# Patient Record
Sex: Female | Born: 1965 | Race: White | Hispanic: No | Smoking: Current every day smoker
Health system: Southern US, Community
[De-identification: ages and names within clinical notes are randomized; demographics above are authoritative.]

## PROBLEM LIST (undated history)

## (undated) DIAGNOSIS — F419 Anxiety disorder, unspecified: Secondary | ICD-10-CM

## (undated) DIAGNOSIS — F32A Depression, unspecified: Secondary | ICD-10-CM

## (undated) DIAGNOSIS — E039 Hypothyroidism, unspecified: Secondary | ICD-10-CM

## (undated) DIAGNOSIS — E119 Type 2 diabetes mellitus without complications: Secondary | ICD-10-CM

## (undated) DIAGNOSIS — I219 Acute myocardial infarction, unspecified: Secondary | ICD-10-CM

## (undated) DIAGNOSIS — M1990 Unspecified osteoarthritis, unspecified site: Secondary | ICD-10-CM

## (undated) DIAGNOSIS — G4730 Sleep apnea, unspecified: Secondary | ICD-10-CM

## (undated) DIAGNOSIS — J40 Bronchitis, not specified as acute or chronic: Secondary | ICD-10-CM

## (undated) DIAGNOSIS — C801 Malignant (primary) neoplasm, unspecified: Secondary | ICD-10-CM

## (undated) DIAGNOSIS — K469 Unspecified abdominal hernia without obstruction or gangrene: Secondary | ICD-10-CM

## (undated) DIAGNOSIS — J449 Chronic obstructive pulmonary disease, unspecified: Secondary | ICD-10-CM

## (undated) DIAGNOSIS — F329 Major depressive disorder, single episode, unspecified: Secondary | ICD-10-CM

## (undated) HISTORY — PX: CHOLECYSTECTOMY: SHX55

## (undated) HISTORY — PX: COLONOSCOPY: SHX174

## (undated) HISTORY — PX: ABDOMINAL HYSTERECTOMY: SHX81

## (undated) HISTORY — PX: UPPER GI ENDOSCOPY: SHX6162

---

## 1998-03-09 HISTORY — PX: BACK SURGERY: SHX140

## 2001-03-08 ENCOUNTER — Emergency Department (HOSPITAL_COMMUNITY): Admission: EM | Admit: 2001-03-08 | Discharge: 2001-03-08 | Payer: Self-pay | Admitting: Emergency Medicine

## 2001-03-10 ENCOUNTER — Emergency Department (HOSPITAL_COMMUNITY): Admission: EM | Admit: 2001-03-10 | Discharge: 2001-03-10 | Payer: Self-pay | Admitting: Emergency Medicine

## 2001-03-18 ENCOUNTER — Emergency Department (HOSPITAL_COMMUNITY): Admission: EM | Admit: 2001-03-18 | Discharge: 2001-03-18 | Payer: Self-pay | Admitting: Emergency Medicine

## 2001-04-30 ENCOUNTER — Emergency Department (HOSPITAL_COMMUNITY): Admission: EM | Admit: 2001-04-30 | Discharge: 2001-04-30 | Payer: Self-pay | Admitting: Emergency Medicine

## 2001-04-30 ENCOUNTER — Encounter: Payer: Self-pay | Admitting: Emergency Medicine

## 2003-04-11 ENCOUNTER — Emergency Department (HOSPITAL_COMMUNITY): Admission: EM | Admit: 2003-04-11 | Discharge: 2003-04-11 | Payer: Self-pay

## 2005-03-09 HISTORY — PX: MASTECTOMY: SHX3

## 2009-03-09 HISTORY — PX: BREAST SURGERY: SHX581

## 2009-04-01 ENCOUNTER — Encounter: Admission: RE | Admit: 2009-04-01 | Discharge: 2009-04-01 | Payer: Self-pay | Admitting: Infectious Diseases

## 2009-05-02 ENCOUNTER — Ambulatory Visit (HOSPITAL_COMMUNITY): Admission: RE | Admit: 2009-05-02 | Discharge: 2009-05-02 | Payer: Self-pay | Admitting: Surgery

## 2009-07-31 ENCOUNTER — Emergency Department (HOSPITAL_COMMUNITY): Admission: EM | Admit: 2009-07-31 | Discharge: 2009-07-31 | Payer: Self-pay | Admitting: Emergency Medicine

## 2009-08-20 ENCOUNTER — Ambulatory Visit (HOSPITAL_COMMUNITY): Admission: RE | Admit: 2009-08-20 | Discharge: 2009-08-20 | Payer: Self-pay | Admitting: Orthopedic Surgery

## 2009-08-22 ENCOUNTER — Ambulatory Visit (HOSPITAL_COMMUNITY): Admission: RE | Admit: 2009-08-22 | Discharge: 2009-08-22 | Payer: Self-pay | Admitting: Gastroenterology

## 2009-09-12 ENCOUNTER — Ambulatory Visit (HOSPITAL_COMMUNITY): Admission: RE | Admit: 2009-09-12 | Discharge: 2009-09-12 | Payer: Self-pay | Admitting: Gastroenterology

## 2009-09-19 ENCOUNTER — Ambulatory Visit (HOSPITAL_COMMUNITY): Admission: RE | Admit: 2009-09-19 | Discharge: 2009-09-19 | Payer: Self-pay | Admitting: Gastroenterology

## 2009-09-26 ENCOUNTER — Ambulatory Visit (HOSPITAL_COMMUNITY): Admission: RE | Admit: 2009-09-26 | Discharge: 2009-09-26 | Payer: Self-pay | Admitting: Gastroenterology

## 2010-04-09 ENCOUNTER — Encounter: Payer: Self-pay | Admitting: Oncology

## 2010-04-22 ENCOUNTER — Emergency Department (HOSPITAL_COMMUNITY): Payer: Medicaid Other

## 2010-04-22 ENCOUNTER — Emergency Department (HOSPITAL_COMMUNITY)
Admission: EM | Admit: 2010-04-22 | Discharge: 2010-04-22 | Disposition: A | Discharge status: Home / Self Care | Payer: Medicaid Other | Attending: Emergency Medicine | Admitting: Emergency Medicine

## 2010-04-22 DIAGNOSIS — Z853 Personal history of malignant neoplasm of breast: Secondary | ICD-10-CM | POA: Insufficient documentation

## 2010-04-22 DIAGNOSIS — W208XXA Other cause of strike by thrown, projected or falling object, initial encounter: Secondary | ICD-10-CM | POA: Insufficient documentation

## 2010-04-22 DIAGNOSIS — IMO0002 Reserved for concepts with insufficient information to code with codable children: Secondary | ICD-10-CM | POA: Insufficient documentation

## 2010-04-22 DIAGNOSIS — E039 Hypothyroidism, unspecified: Secondary | ICD-10-CM | POA: Insufficient documentation

## 2010-04-22 DIAGNOSIS — Y92009 Unspecified place in unspecified non-institutional (private) residence as the place of occurrence of the external cause: Secondary | ICD-10-CM | POA: Insufficient documentation

## 2010-04-22 DIAGNOSIS — S51809A Unspecified open wound of unspecified forearm, initial encounter: Secondary | ICD-10-CM | POA: Insufficient documentation

## 2010-04-22 DIAGNOSIS — S6990XA Unspecified injury of unspecified wrist, hand and finger(s), initial encounter: Secondary | ICD-10-CM | POA: Insufficient documentation

## 2010-04-22 DIAGNOSIS — M7989 Other specified soft tissue disorders: Secondary | ICD-10-CM | POA: Insufficient documentation

## 2010-04-22 DIAGNOSIS — E119 Type 2 diabetes mellitus without complications: Secondary | ICD-10-CM | POA: Insufficient documentation

## 2010-04-22 DIAGNOSIS — S59909A Unspecified injury of unspecified elbow, initial encounter: Secondary | ICD-10-CM | POA: Insufficient documentation

## 2010-04-22 DIAGNOSIS — Z79899 Other long term (current) drug therapy: Secondary | ICD-10-CM | POA: Insufficient documentation

## 2010-04-22 DIAGNOSIS — S5010XA Contusion of unspecified forearm, initial encounter: Secondary | ICD-10-CM | POA: Insufficient documentation

## 2010-04-22 DIAGNOSIS — M79609 Pain in unspecified limb: Secondary | ICD-10-CM | POA: Insufficient documentation

## 2010-04-22 DIAGNOSIS — E669 Obesity, unspecified: Secondary | ICD-10-CM | POA: Insufficient documentation

## 2010-05-25 ENCOUNTER — Emergency Department (HOSPITAL_COMMUNITY): Payer: Medicaid Other

## 2010-05-25 ENCOUNTER — Emergency Department (HOSPITAL_COMMUNITY)
Admission: EM | Admit: 2010-05-25 | Discharge: 2010-05-25 | Disposition: A | Discharge status: Home / Self Care | Payer: Medicaid Other | Attending: Emergency Medicine | Admitting: Emergency Medicine

## 2010-05-25 DIAGNOSIS — IMO0001 Reserved for inherently not codable concepts without codable children: Secondary | ICD-10-CM | POA: Insufficient documentation

## 2010-05-25 DIAGNOSIS — M25569 Pain in unspecified knee: Secondary | ICD-10-CM | POA: Insufficient documentation

## 2010-05-25 DIAGNOSIS — E039 Hypothyroidism, unspecified: Secondary | ICD-10-CM | POA: Insufficient documentation

## 2010-05-25 DIAGNOSIS — Y92009 Unspecified place in unspecified non-institutional (private) residence as the place of occurrence of the external cause: Secondary | ICD-10-CM | POA: Insufficient documentation

## 2010-05-25 DIAGNOSIS — E119 Type 2 diabetes mellitus without complications: Secondary | ICD-10-CM | POA: Insufficient documentation

## 2010-05-25 DIAGNOSIS — IMO0002 Reserved for concepts with insufficient information to code with codable children: Secondary | ICD-10-CM | POA: Insufficient documentation

## 2010-05-25 DIAGNOSIS — R0789 Other chest pain: Secondary | ICD-10-CM | POA: Insufficient documentation

## 2010-05-25 DIAGNOSIS — R040 Epistaxis: Secondary | ICD-10-CM | POA: Insufficient documentation

## 2010-05-25 DIAGNOSIS — S8000XA Contusion of unspecified knee, initial encounter: Secondary | ICD-10-CM | POA: Insufficient documentation

## 2010-05-29 LAB — BASIC METABOLIC PANEL
CO2: 24 mEq/L (ref 19–32)
Calcium: 9.7 mg/dL (ref 8.4–10.5)
Glucose, Bld: 405 mg/dL — ABNORMAL HIGH (ref 70–99)
Sodium: 133 mEq/L — ABNORMAL LOW (ref 135–145)

## 2010-05-29 LAB — GLUCOSE, CAPILLARY: Glucose-Capillary: 227 mg/dL — ABNORMAL HIGH (ref 70–99)

## 2010-05-29 LAB — CBC
HCT: 43.9 % (ref 36.0–46.0)
Hemoglobin: 15.2 g/dL — ABNORMAL HIGH (ref 12.0–15.0)
MCHC: 34.6 g/dL (ref 30.0–36.0)
MCV: 93.8 fL (ref 78.0–100.0)
RDW: 14.1 % (ref 11.5–15.5)

## 2010-06-21 ENCOUNTER — Emergency Department (HOSPITAL_COMMUNITY)
Admission: EM | Admit: 2010-06-21 | Discharge: 2010-06-21 | Disposition: A | Discharge status: Home / Self Care | Payer: Medicaid Other | Attending: Emergency Medicine | Admitting: Emergency Medicine

## 2010-06-21 ENCOUNTER — Emergency Department (HOSPITAL_COMMUNITY): Payer: Medicaid Other

## 2010-06-21 DIAGNOSIS — M51379 Other intervertebral disc degeneration, lumbosacral region without mention of lumbar back pain or lower extremity pain: Secondary | ICD-10-CM | POA: Insufficient documentation

## 2010-06-21 DIAGNOSIS — M5137 Other intervertebral disc degeneration, lumbosacral region: Secondary | ICD-10-CM | POA: Insufficient documentation

## 2010-06-21 DIAGNOSIS — Z853 Personal history of malignant neoplasm of breast: Secondary | ICD-10-CM | POA: Insufficient documentation

## 2010-06-21 DIAGNOSIS — M549 Dorsalgia, unspecified: Secondary | ICD-10-CM | POA: Insufficient documentation

## 2010-06-21 DIAGNOSIS — W010XXA Fall on same level from slipping, tripping and stumbling without subsequent striking against object, initial encounter: Secondary | ICD-10-CM | POA: Insufficient documentation

## 2010-06-21 DIAGNOSIS — T148XXA Other injury of unspecified body region, initial encounter: Secondary | ICD-10-CM | POA: Insufficient documentation

## 2010-09-09 ENCOUNTER — Emergency Department (HOSPITAL_COMMUNITY): Payer: Medicaid Other

## 2010-09-09 ENCOUNTER — Emergency Department (HOSPITAL_COMMUNITY)
Admission: EM | Admit: 2010-09-09 | Discharge: 2010-09-09 | Disposition: A | Discharge status: Home / Self Care | Payer: Medicaid Other | Attending: Emergency Medicine | Admitting: Emergency Medicine

## 2010-09-09 DIAGNOSIS — E119 Type 2 diabetes mellitus without complications: Secondary | ICD-10-CM | POA: Insufficient documentation

## 2010-09-09 DIAGNOSIS — X500XXA Overexertion from strenuous movement or load, initial encounter: Secondary | ICD-10-CM | POA: Insufficient documentation

## 2010-09-09 DIAGNOSIS — E039 Hypothyroidism, unspecified: Secondary | ICD-10-CM | POA: Insufficient documentation

## 2010-09-09 DIAGNOSIS — Z853 Personal history of malignant neoplasm of breast: Secondary | ICD-10-CM | POA: Insufficient documentation

## 2010-09-09 DIAGNOSIS — G589 Mononeuropathy, unspecified: Secondary | ICD-10-CM | POA: Insufficient documentation

## 2010-09-09 DIAGNOSIS — M545 Low back pain, unspecified: Secondary | ICD-10-CM | POA: Insufficient documentation

## 2011-03-27 ENCOUNTER — Encounter (INDEPENDENT_AMBULATORY_CARE_PROVIDER_SITE_OTHER): Payer: Self-pay | Admitting: Surgery

## 2011-08-12 IMAGING — CR DG LUMBAR SPINE COMPLETE 4+V
5 series · 5 of 5 positions shown · non-contrast
Comparison: MRI 08/20/2009.  Radiography 07/31/2009

CLINICAL DATA: Fell.  Pain.

LUMBAR SPINE - COMPLETE 4+ VIEW

[t l-spine a.p.]
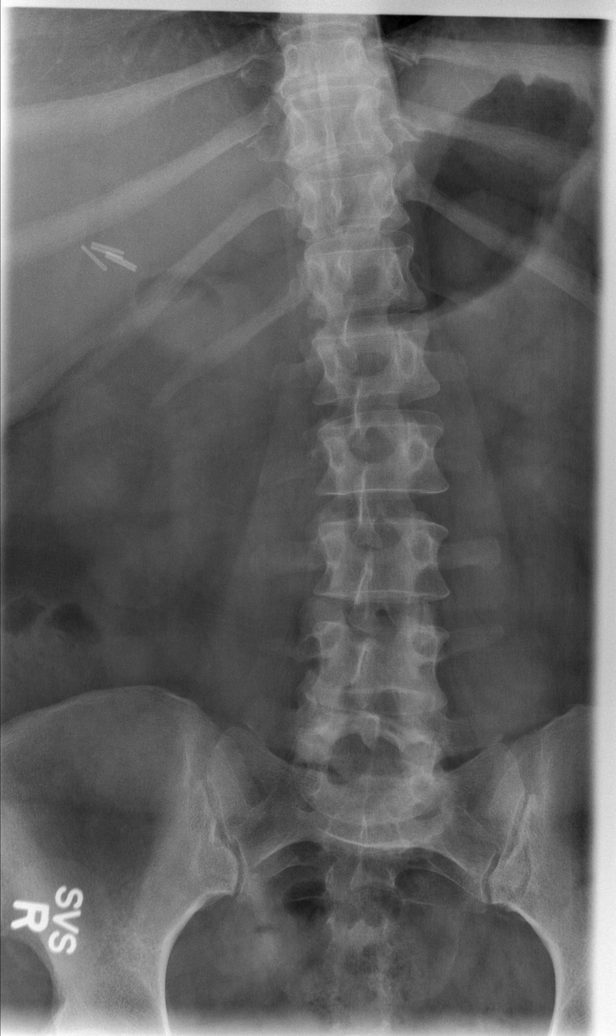

[t l-spine oblique exposure (1 of 2)]
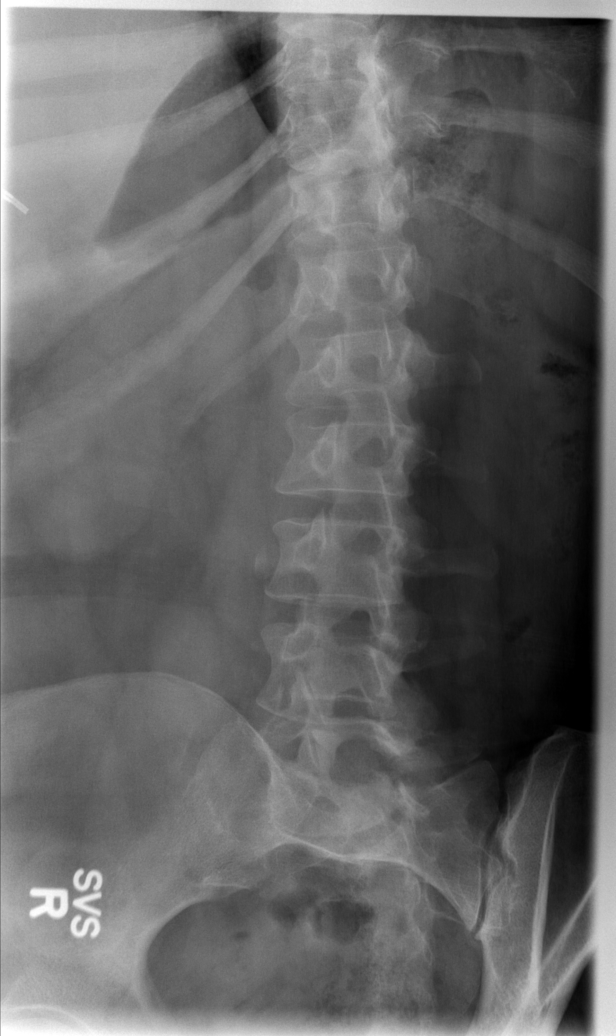

[t l-spine oblique exposure (2 of 2)]
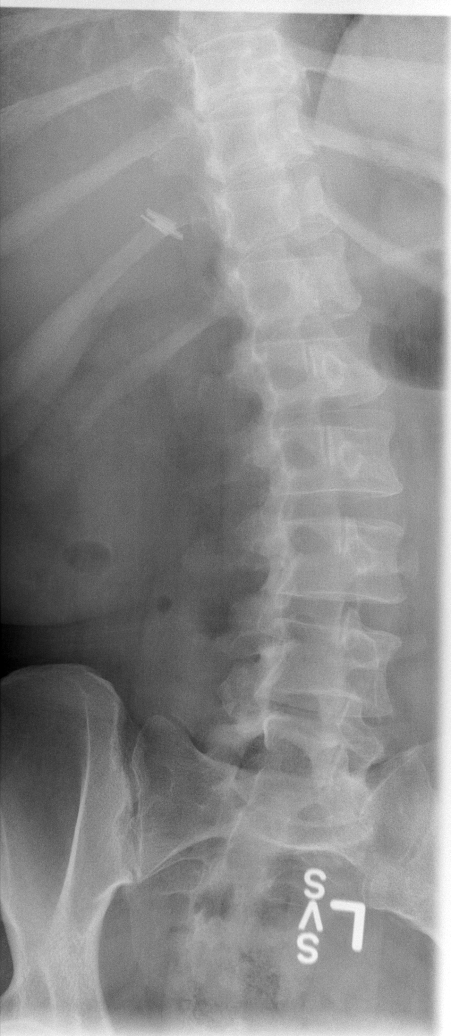

[t l-spine lat]
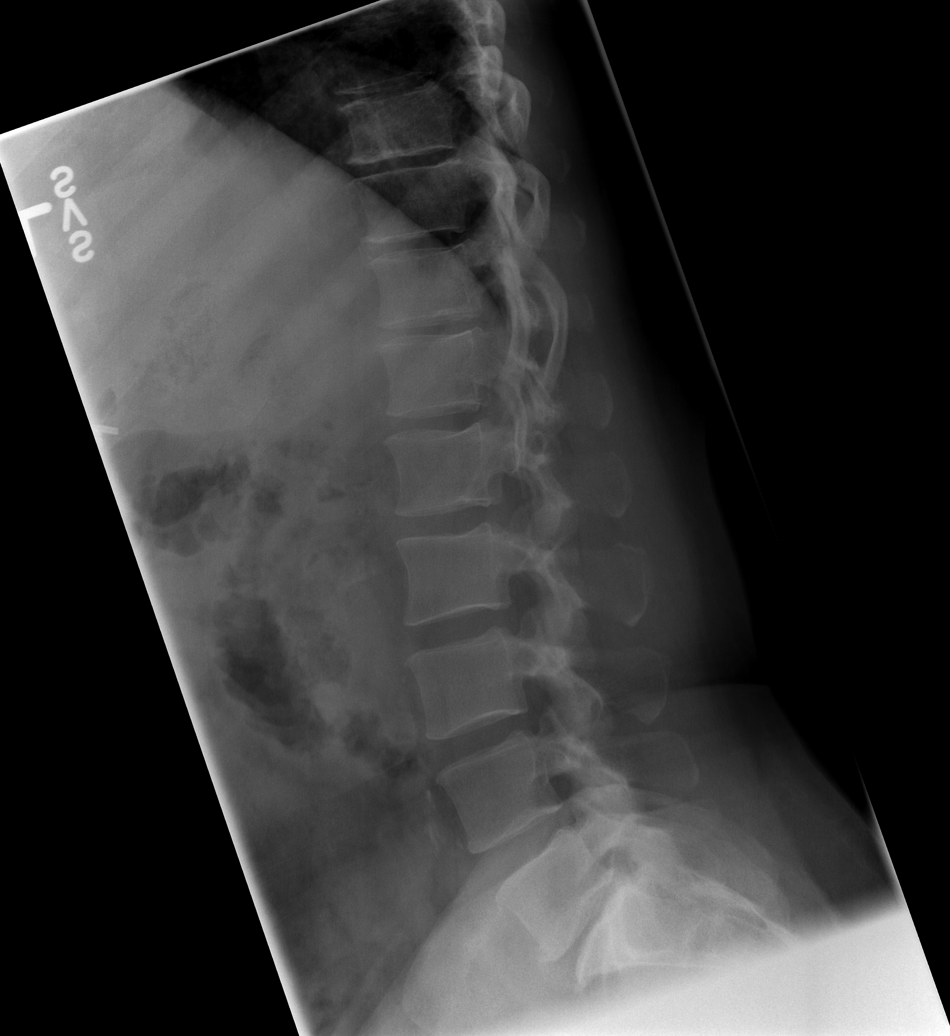

[t l-spine l5-s1 spot]
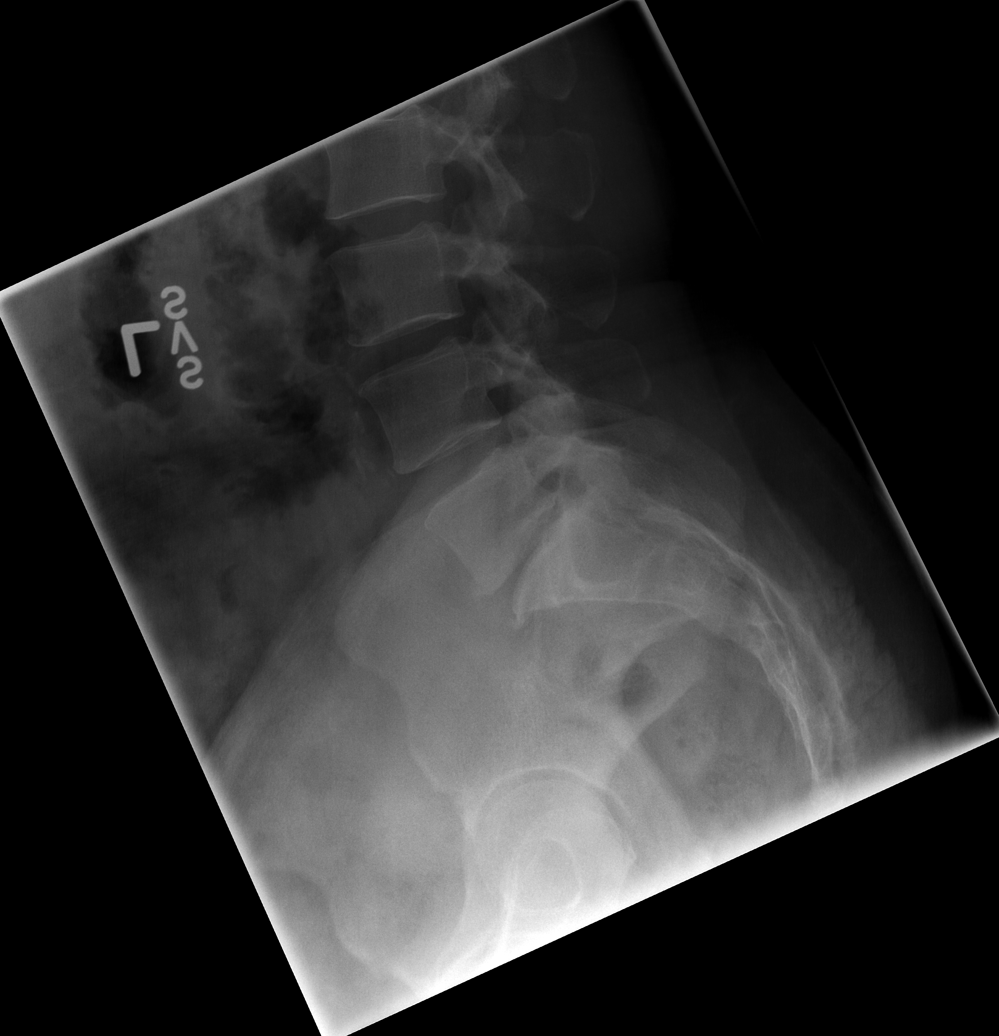

[5 of 5 positions shown; findings below may reference images not displayed]

FINDINGS: No abnormality is seen at L4-5 or above.  At L5-S1, there
is chronic degenerative change with loss of disc height and
marginal osteophytes.  No evidence of slippage.  Sacroiliac joints
appear unremarkable.
IMPRESSION: Chronic degenerative disc disease L5-S1.  No gross change when
compared to previous exams.

## 2012-11-23 ENCOUNTER — Encounter (HOSPITAL_BASED_OUTPATIENT_CLINIC_OR_DEPARTMENT_OTHER): Payer: Self-pay | Admitting: *Deleted

## 2012-11-23 NOTE — Progress Notes (Signed)
Pt had mast 07 with chemo-rad-diabetic-copd-trying to quit smoking-anxiety-had pain from a hernia dr Aurelio Jew is aware Told to bring all meds and inhalers-overnight bag and cpap-may need to stay due to sleep apnea

## 2012-11-25 ENCOUNTER — Encounter (HOSPITAL_BASED_OUTPATIENT_CLINIC_OR_DEPARTMENT_OTHER)
Admission: RE | Admit: 2012-11-25 | Discharge: 2012-11-25 | Disposition: A | Discharge status: Home / Self Care | Payer: Medicaid Other | Source: Ambulatory Visit | Attending: Specialist | Admitting: Specialist

## 2012-11-25 LAB — BASIC METABOLIC PANEL
BUN: 16 mg/dL (ref 6–23)
Chloride: 97 mEq/L (ref 96–112)
Glucose, Bld: 118 mg/dL — ABNORMAL HIGH (ref 70–99)
Potassium: 4.4 mEq/L (ref 3.5–5.1)

## 2012-11-28 ENCOUNTER — Encounter (HOSPITAL_BASED_OUTPATIENT_CLINIC_OR_DEPARTMENT_OTHER): Payer: Self-pay | Admitting: Anesthesiology

## 2012-11-28 ENCOUNTER — Ambulatory Visit (HOSPITAL_BASED_OUTPATIENT_CLINIC_OR_DEPARTMENT_OTHER)
Admission: RE | Admit: 2012-11-28 | Discharge: 2012-11-28 | Disposition: A | Discharge status: Home / Self Care | Payer: Medicaid Other | Source: Ambulatory Visit | Attending: Specialist | Admitting: Specialist

## 2012-11-28 ENCOUNTER — Encounter (HOSPITAL_BASED_OUTPATIENT_CLINIC_OR_DEPARTMENT_OTHER)
Admission: RE | Disposition: A | Discharge status: Home / Self Care | Payer: Self-pay | Source: Ambulatory Visit | Attending: Specialist

## 2012-11-28 ENCOUNTER — Ambulatory Visit (HOSPITAL_BASED_OUTPATIENT_CLINIC_OR_DEPARTMENT_OTHER): Payer: Medicaid Other | Admitting: Anesthesiology

## 2012-11-28 DIAGNOSIS — Z9010 Acquired absence of unspecified breast and nipple: Secondary | ICD-10-CM | POA: Insufficient documentation

## 2012-11-28 DIAGNOSIS — Z853 Personal history of malignant neoplasm of breast: Secondary | ICD-10-CM | POA: Insufficient documentation

## 2012-11-28 DIAGNOSIS — J4489 Other specified chronic obstructive pulmonary disease: Secondary | ICD-10-CM | POA: Insufficient documentation

## 2012-11-28 DIAGNOSIS — Z79899 Other long term (current) drug therapy: Secondary | ICD-10-CM | POA: Insufficient documentation

## 2012-11-28 DIAGNOSIS — L905 Scar conditions and fibrosis of skin: Secondary | ICD-10-CM | POA: Insufficient documentation

## 2012-11-28 DIAGNOSIS — E119 Type 2 diabetes mellitus without complications: Secondary | ICD-10-CM | POA: Insufficient documentation

## 2012-11-28 DIAGNOSIS — Z421 Encounter for breast reconstruction following mastectomy: Secondary | ICD-10-CM | POA: Insufficient documentation

## 2012-11-28 DIAGNOSIS — J449 Chronic obstructive pulmonary disease, unspecified: Secondary | ICD-10-CM | POA: Insufficient documentation

## 2012-11-28 HISTORY — DX: Depression, unspecified: F32.A

## 2012-11-28 HISTORY — PX: BREAST RECONSTRUCTION: SHX9

## 2012-11-28 HISTORY — DX: Type 2 diabetes mellitus without complications: E11.9

## 2012-11-28 HISTORY — DX: Major depressive disorder, single episode, unspecified: F32.9

## 2012-11-28 HISTORY — DX: Unspecified osteoarthritis, unspecified site: M19.90

## 2012-11-28 HISTORY — DX: Chronic obstructive pulmonary disease, unspecified: J44.9

## 2012-11-28 HISTORY — DX: Bronchitis, not specified as acute or chronic: J40

## 2012-11-28 HISTORY — DX: Unspecified abdominal hernia without obstruction or gangrene: K46.9

## 2012-11-28 HISTORY — PX: TISSUE EXPANDER PLACEMENT: SHX2530

## 2012-11-28 HISTORY — DX: Sleep apnea, unspecified: G47.30

## 2012-11-28 HISTORY — DX: Hypothyroidism, unspecified: E03.9

## 2012-11-28 HISTORY — DX: Anxiety disorder, unspecified: F41.9

## 2012-11-28 LAB — GLUCOSE, CAPILLARY
Glucose-Capillary: 163 mg/dL — ABNORMAL HIGH (ref 70–99)
Glucose-Capillary: 190 mg/dL — ABNORMAL HIGH (ref 70–99)

## 2012-11-28 SURGERY — RECONSTRUCTION, BREAST
Anesthesia: General | Site: Breast | Laterality: Right | Wound class: Clean

## 2012-11-28 MED ORDER — PROPOFOL 10 MG/ML IV BOLUS
INTRAVENOUS | Status: DC | PRN
  Administered 2012-11-28: 150 mg via INTRAVENOUS

## 2012-11-28 MED ORDER — MORPHINE SULFATE 10 MG/ML IJ SOLN
INTRAMUSCULAR | Status: DC | PRN
  Administered 2012-11-28 (×2): 3 mg via INTRAVENOUS
  Administered 2012-11-28: 2 mg via INTRAVENOUS

## 2012-11-28 MED ORDER — SODIUM CHLORIDE 0.9 % IV SOLN
INTRAVENOUS | Status: DC | PRN
  Administered 2012-11-28: 09:00:00

## 2012-11-28 MED ORDER — OXYCODONE HCL 5 MG PO TABS
5.0000 mg | ORAL_TABLET | Freq: Once | ORAL | Status: AC | PRN
  Administered 2012-11-28: 5 mg via ORAL

## 2012-11-28 MED ORDER — MIDAZOLAM HCL 5 MG/5ML IJ SOLN
INTRAMUSCULAR | Status: DC | PRN
  Administered 2012-11-28: 2 mg via INTRAVENOUS

## 2012-11-28 MED ORDER — LIDOCAINE HCL (CARDIAC) 20 MG/ML IV SOLN
INTRAVENOUS | Status: DC | PRN
  Administered 2012-11-28: 100 mg via INTRAVENOUS

## 2012-11-28 MED ORDER — LACTATED RINGERS IV SOLN
INTRAVENOUS | Status: DC
  Administered 2012-11-28: 09:00:00 via INTRAVENOUS
  Administered 2012-11-28: 20 mL/h via INTRAVENOUS
  Administered 2012-11-28: 10:00:00 via INTRAVENOUS
  Administered 2012-11-28: 20 mL/h via INTRAVENOUS

## 2012-11-28 MED ORDER — MEPERIDINE HCL 25 MG/ML IJ SOLN: 6.2500 mg | INTRAMUSCULAR | Status: DC | PRN

## 2012-11-28 MED ORDER — MIDAZOLAM HCL 2 MG/2ML IJ SOLN: 1.0000 mg | INTRAMUSCULAR | Status: DC | PRN

## 2012-11-28 MED ORDER — LIDOCAINE-EPINEPHRINE 0.5 %-1:200000 IJ SOLN
INTRAMUSCULAR | Status: DC | PRN
  Administered 2012-11-28: 100 mL

## 2012-11-28 MED ORDER — CEFAZOLIN SODIUM-DEXTROSE 2-3 GM-% IV SOLR
2.0000 g | INTRAVENOUS | Status: AC
  Administered 2012-11-28: 2 g via INTRAVENOUS

## 2012-11-28 MED ORDER — FENTANYL CITRATE 0.05 MG/ML IJ SOLN
INTRAMUSCULAR | Status: DC | PRN
  Administered 2012-11-28: 100 ug via INTRAVENOUS

## 2012-11-28 MED ORDER — ONDANSETRON HCL 4 MG/2ML IJ SOLN: 4.0000 mg | Freq: Once | INTRAMUSCULAR | Status: DC | PRN

## 2012-11-28 MED ORDER — HYDROMORPHONE HCL PF 1 MG/ML IJ SOLN
0.2500 mg | INTRAMUSCULAR | Status: DC | PRN
  Administered 2012-11-28 (×6): 0.5 mg via INTRAVENOUS

## 2012-11-28 MED ORDER — OXYCODONE HCL 5 MG/5ML PO SOLN: 5.0000 mg | Freq: Once | ORAL | Status: AC | PRN

## 2012-11-28 MED ORDER — ONDANSETRON HCL 4 MG/2ML IJ SOLN
INTRAMUSCULAR | Status: DC | PRN
  Administered 2012-11-28: 4 mg via INTRAVENOUS

## 2012-11-28 MED ORDER — EPHEDRINE SULFATE 50 MG/ML IJ SOLN
INTRAMUSCULAR | Status: DC | PRN
  Administered 2012-11-28 (×2): 10 mg via INTRAVENOUS

## 2012-11-28 MED ORDER — DEXAMETHASONE SODIUM PHOSPHATE 4 MG/ML IJ SOLN
INTRAMUSCULAR | Status: DC | PRN
  Administered 2012-11-28: 10 mg via INTRAVENOUS

## 2012-11-28 MED ORDER — FENTANYL CITRATE 0.05 MG/ML IJ SOLN: 50.0000 ug | INTRAMUSCULAR | Status: DC | PRN

## 2012-11-28 SURGICAL SUPPLY — 69 items
BAG DECANTER FOR FLEXI CONT (MISCELLANEOUS) ×2 IMPLANT
BENZOIN TINCTURE PRP APPL 2/3 (GAUZE/BANDAGES/DRESSINGS) ×2 IMPLANT
BLADE HEX COATED 2.75 (ELECTRODE) ×2 IMPLANT
BLADE KNIFE PERSONA 10 (BLADE) ×2 IMPLANT
BLADE KNIFE PERSONA 15 (BLADE) ×2 IMPLANT
CANISTER SUCTION 1200CC (MISCELLANEOUS) ×2 IMPLANT
CLOTH BEACON ORANGE TIMEOUT ST (SAFETY) ×2 IMPLANT
COVER MAYO STAND STRL (DRAPES) ×2 IMPLANT
COVER TABLE BACK 60X90 (DRAPES) ×2 IMPLANT
DECANTER SPIKE VIAL GLASS SM (MISCELLANEOUS) ×2 IMPLANT
DRAIN CHANNEL 10M FLAT 3/4 FLT (DRAIN) ×2 IMPLANT
DRAIN CHANNEL 7F 3/4 FLAT (WOUND CARE) IMPLANT
DRAIN PENROSE 1/4X12 LTX STRL (WOUND CARE) IMPLANT
DRAPE LAPAROSCOPIC ABDOMINAL (DRAPES) ×2 IMPLANT
DRSG PAD ABDOMINAL 8X10 ST (GAUZE/BANDAGES/DRESSINGS) ×4 IMPLANT
ELECT BLADE 6.5 .24CM SHAFT (ELECTRODE) ×2 IMPLANT
ELECT REM PT RETURN 9FT ADLT (ELECTROSURGICAL) ×2
ELECTRODE REM PT RTRN 9FT ADLT (ELECTROSURGICAL) ×1 IMPLANT
EVACUATOR SILICONE 100CC (DRAIN) ×2 IMPLANT
FILTER 7/8 IN (FILTER) ×2 IMPLANT
GAUZE SPONGE 4X4 12PLY STRL LF (GAUZE/BANDAGES/DRESSINGS) IMPLANT
GAUZE XEROFORM 1X8 LF (GAUZE/BANDAGES/DRESSINGS) ×2 IMPLANT
GAUZE XEROFORM 5X9 LF (GAUZE/BANDAGES/DRESSINGS) ×2 IMPLANT
GLOVE BIO SURGEON STRL SZ 6.5 (GLOVE) ×2 IMPLANT
GLOVE BIOGEL M STRL SZ7.5 (GLOVE) ×2 IMPLANT
GLOVE BIOGEL PI IND STRL 8 (GLOVE) ×1 IMPLANT
GLOVE BIOGEL PI INDICATOR 8 (GLOVE) ×1
GLOVE ECLIPSE 7.0 STRL STRAW (GLOVE) ×2 IMPLANT
GOWN PREVENTION PLUS XLARGE (GOWN DISPOSABLE) IMPLANT
GOWN PREVENTION PLUS XXLARGE (GOWN DISPOSABLE) ×2 IMPLANT
IMPL SALINE SMOOTH RND 575 (Breast) ×1 IMPLANT
IMPLANT SALINE SMOOTH RND 575 (Breast) ×2 IMPLANT
IV NS 500ML (IV SOLUTION) ×4
IV NS 500ML BAXH (IV SOLUTION) ×4 IMPLANT
KIT FILL SYSTEM UNIVERSAL (SET/KITS/TRAYS/PACK) ×2 IMPLANT
NDL SAFETY ECLIPSE 18X1.5 (NEEDLE) IMPLANT
NEEDLE HYPO 18GX1.5 SHARP (NEEDLE)
NEEDLE HYPO 25X1 1.5 SAFETY (NEEDLE) IMPLANT
NEEDLE SPNL 18GX3.5 QUINCKE PK (NEEDLE) ×2 IMPLANT
NS IRRIG 1000ML POUR BTL (IV SOLUTION) ×2 IMPLANT
PACK BASIN DAY SURGERY FS (CUSTOM PROCEDURE TRAY) ×2 IMPLANT
PEN SKIN MARKING BROAD TIP (MISCELLANEOUS) ×2 IMPLANT
PIN SAFETY STERILE (MISCELLANEOUS) ×2 IMPLANT
SET ASEPTIC TRANSFER (MISCELLANEOUS) ×2 IMPLANT
SLEEVE SCD COMPRESS KNEE MED (MISCELLANEOUS) ×2 IMPLANT
SPECIMEN JAR MEDIUM (MISCELLANEOUS) ×4 IMPLANT
SPONGE GAUZE 4X4 12PLY (GAUZE/BANDAGES/DRESSINGS) ×2 IMPLANT
SPONGE LAP 18X18 X RAY DECT (DISPOSABLE) ×4 IMPLANT
STRIP SUTURE WOUND CLOSURE 1/2 (SUTURE) ×4 IMPLANT
SUT ETHILON 3 0 PS 1 (SUTURE) IMPLANT
SUT MNCRL AB 3-0 PS2 18 (SUTURE) ×2 IMPLANT
SUT MON AB 2-0 CT1 36 (SUTURE) ×2 IMPLANT
SUT MON AB 5-0 PS2 18 (SUTURE) IMPLANT
SUT PROLENE 3 0 PS 1 (SUTURE) ×2 IMPLANT
SUT PROLENE 3 0 PS 2 (SUTURE) IMPLANT
SUT PROLENE 4 0 PS 2 18 (SUTURE) IMPLANT
SYR 20CC LL (SYRINGE) IMPLANT
SYR 50ML LL SCALE MARK (SYRINGE) ×4 IMPLANT
SYR BULB IRRIGATION 50ML (SYRINGE) ×2 IMPLANT
SYR CONTROL 10ML LL (SYRINGE) ×2 IMPLANT
TAPE HYPAFIX 6X30 (GAUZE/BANDAGES/DRESSINGS) ×2 IMPLANT
TAPE MEASURE 72IN RETRACT (INSTRUMENTS)
TAPE MEASURE LINEN 72IN RETRCT (INSTRUMENTS) IMPLANT
TOWEL OR 17X24 6PK STRL BLUE (TOWEL DISPOSABLE) ×8 IMPLANT
TRAY DSU PREP LF (CUSTOM PROCEDURE TRAY) ×2 IMPLANT
TUBE CONNECTING 20X1/4 (TUBING) ×2 IMPLANT
UNDERPAD 30X30 INCONTINENT (UNDERPADS AND DIAPERS) ×4 IMPLANT
VAC PENCILS W/TUBING CLEAR (MISCELLANEOUS) ×2 IMPLANT
YANKAUER SUCT BULB TIP NO VENT (SUCTIONS) ×2 IMPLANT

## 2012-11-28 NOTE — Brief Op Note (Signed)
11/28/2012  10:09 AM  PATIENT:  Genia Del  47 y.o. female  PRE-OPERATIVE DIAGNOSIS:  STATUS POST MASTECTOMY RIGHT BREAST   POST-OPERATIVE DIAGNOSIS:  STATUS POST MASTECTOMY RIGHT BREAST   PROCEDURE:  Procedure(s): RIGHT BREAST RECONSTRUCTION WITH PLACEMENT OF TISSUE EXPANDER  (Right) TISSUE EXPANDER (Right)  SURGEON:  Surgeon(s) and Role:    * Louisa Second, MD - Primary  PHYSICIAN ASSISTANT:   ASSISTANTS: none   ANESTHESIA:   general  EBL:  Total I/O In: 1500 [I.V.:1500] Out: -   BLOOD ADMINISTERED:none  DRAINS: (right lateral edge of the wound) Jackson-Pratt drain(s) with closed bulb suction in the right lateral mwound edge  LOCAL MEDICATIONS USED:  LIDOCAINE   SPECIMEN:  Excision  DISPOSITION OF SPECIMEN:  PATHOLOGY  COUNTS:  YES  TOURNIQUET:  * No tourniquets in log *  DICTATION: .Other Dictation: Dictation Number [id number]  PLAN OF CARE: Discharge to home after PACU  PATIENT DISPOSITION:  PACU - hemodynamically stable.   Delay start of Pharmacological VTE agent (>24hrs) due to surgical blood loss or risk of bleeding: yes

## 2012-11-28 NOTE — Progress Notes (Signed)
Dr Shon Hough called again and updated of 255cc total sangeous drainage from right JP drain.  No orders given.  Dr Shon Hough request another ace wrap and abd pads to bedside, will come assess pt now.

## 2012-11-28 NOTE — Progress Notes (Signed)
Dr Edilia Bo at bedside.  Checked dressing, applied ABDs, new ace wrap and holding manual pressure at bedside for 5 minute at this time. Pt tolerating well. NAD, VSS. Monitoring closely.

## 2012-11-28 NOTE — Anesthesia Postprocedure Evaluation (Signed)
Anesthesia Post Note  Patient: Christy Fuentes  Procedure(s) Performed: Procedure(s) (LRB): RIGHT BREAST RECONSTRUCTION WITH PLACEMENT OF TISSUE EXPANDER  (Right) TISSUE EXPANDER (Right)  Anesthesia type: general  Patient location: PACU  Post pain: Pain level controlled  Post assessment: Patient's Cardiovascular Status Stable  Last Vitals:  Filed Vitals:   11/28/12 1315  BP: 109/62  Pulse: 77  Temp:   Resp: 10    Post vital signs: Reviewed and stable  Level of consciousness: sedated  Complications: No apparent anesthesia complications

## 2012-11-28 NOTE — H&P (Signed)
Christy Fuentes is an 47 y.o. female.   Chief Complaint: Right breast cancer HPI: SPright mastectomy for breast cancer desires reconstruction  Past Medical History  Diagnosis Date  . Hypothyroidism   . Anxiety   . Depression   . COPD (chronic obstructive pulmonary disease)   . Sleep apnea     uses a cpap  . Diabetes mellitus without complication   . Arthritis   . Hernia   . Bronchitis     Past Surgical History  Procedure Laterality Date  . Abdominal hysterectomy    . Cholecystectomy    . Back surgery  2000    lumb lam  . Mastectomy  2007    right mast-snbx-chemo-radiation  . Breast surgery  2011    lt br bx  . Upper gi endoscopy    . Colonoscopy      History reviewed. No pertinent family history. Social History:  reports that she has been smoking.  She does not have any smokeless tobacco history on file. She reports that she does not drink alcohol or use illicit drugs.  Allergies:  Allergies  Allergen Reactions  . Penicillins Itching    Medications Prior to Admission  Medication Sig Dispense Refill  . albuterol (PROVENTIL HFA;VENTOLIN HFA) 108 (90 BASE) MCG/ACT inhaler Inhale 2 puffs into the lungs every 6 (six) hours as needed for wheezing.      Marland Kitchen ALPRAZolam (XANAX) 1 MG tablet Take 1 mg by mouth 3 (three) times daily as needed for sleep.      . budesonide-formoterol (SYMBICORT) 160-4.5 MCG/ACT inhaler Inhale 2 puffs into the lungs 2 (two) times daily.      Marland Kitchen gabapentin (NEURONTIN) 300 MG capsule Take 300 mg by mouth 3 (three) times daily.      Marland Kitchen glyBURIDE (DIABETA) 5 MG tablet Take 5 mg by mouth 2 (two) times daily with a meal.      . HYDROcodone-acetaminophen (NORCO) 10-325 MG per tablet Take 1 tablet by mouth every 6 (six) hours as needed for pain (takes 3xdaily).      Marland Kitchen levothyroxine (SYNTHROID, LEVOTHROID) 150 MCG tablet Take 150 mcg by mouth daily before breakfast.      . promethazine (PHENERGAN) 25 MG tablet Take 25 mg by mouth every 6 (six) hours as needed for  nausea.      Marland Kitchen tiotropium (SPIRIVA) 18 MCG inhalation capsule Place 18 mcg into inhaler and inhale daily.        No results found for this or any previous visit (from the past 48 hour(s)). No results found.  Review of Systems  Constitutional: Negative.   HENT: Negative.   Eyes: Negative.   Respiratory: Negative.   Cardiovascular: Negative.   Gastrointestinal: Negative.   Genitourinary: Negative.   Musculoskeletal: Negative.   Skin: Negative.   Neurological: Negative.   Endo/Heme/Allergies: Negative.   Psychiatric/Behavioral: Negative.     Blood pressure 123/86, pulse 76, temperature 97.6 F (36.4 C), temperature source Oral, resp. rate 20, height 5\' 5"  (1.651 m), weight 75.354 kg (166 lb 2 oz), SpO2 100.00%. Physical Exam   Assessment/Plan Right breast cancer for tissue expansion reconstruction  Christy Fuentes 11/28/2012, 8:37 AM

## 2012-11-28 NOTE — Progress Notes (Signed)
Spoke with Dr Shon Hough. Gave PT status update along with drainage amount from JP drain.  Instructed her to be d/c from our facility and come right to his office now.  Pt and supporting family verbalized understanding and states will go directly to office.

## 2012-11-28 NOTE — Progress Notes (Signed)
Dr Shon Hough called, alerted of JP drainage amount of 130cc bright red sangeous d/c within the last 30 minutes.  Instructed to have compresssion ace wrap applied to site via OR circulator and to watch closely.  No other orders at this time.   VSS.  Pt moved back to Phase I PACU care, placed on monitor to observe closely.  NAD at this time.

## 2012-11-28 NOTE — Transfer of Care (Signed)
Immediate Anesthesia Transfer of Care Note  Patient: Christy Fuentes  Procedure(s) Performed: Procedure(s): RIGHT BREAST RECONSTRUCTION WITH PLACEMENT OF TISSUE EXPANDER  (Right) TISSUE EXPANDER (Right)  Patient Location: PACU  Anesthesia Type:General  Level of Consciousness: awake and alert   Airway & Oxygen Therapy: Patient Spontanous Breathing and Patient connected to face mask oxygen  Post-op Assessment: Report given to PACU RN and Post -op Vital signs reviewed and stable  Post vital signs: Reviewed and stable  Complications: No apparent anesthesia complications

## 2012-11-28 NOTE — Discharge Instructions (Signed)
Activity (include date of return to work if known) As tolerated: NO showers NO driving No heavy activities  Diet:regular No restrictions:  Wound Care: Keep dressing clean & dry  Don not change dressings     Post Anesthesia Home Care Instructions  Activity: Get plenty of rest for the remainder of the day. A responsible adult should stay with you for 24 hours following the procedure.  For the next 24 hours, DO NOT: -Drive a car -Advertising copywriter -Drink alcoholic beverages -Take any medication unless instructed by your physician -Make any legal decisions or sign important papers.  Meals: Start with liquid foods such as gelatin or soup. Progress to regular foods as tolerated. Avoid greasy, spicy, heavy foods. If nausea and/or vomiting occur, drink only clear liquids until the nausea and/or vomiting subsides. Call your physician if vomiting continues.  Special Instructions/Symptoms: Your throat may feel dry or sore from the anesthesia or the breathing tube placed in your throat during surgery. If this causes discomfort, gargle with warm salt water. The discomfort should disappear within 24 hours.   About my Jackson-Pratt Bulb Drain  What is a Jackson-Pratt bulb? A Jackson-Pratt is a soft, round device used to collect drainage. It is connected to a long, thin drainage catheter, which is held in place by one or two small stiches near your surgical incision site. When the bulb is squeezed, it forms a vacuum, forcing the drainage to empty into the bulb.  Emptying the Jackson-Pratt bulb- To empty the bulb: 1. Release the plug on the top of the bulb. 2. Pour the bulb's contents into a measuring container which your nurse will provide. 3. Record the time emptied and amount of drainage. Empty the drain(s) as often as your     doctor or nurse recommends.  Date                  Time                    Amount (Drain 1)                 Amount (Drain  2)  _____________________________________________________________________  _____________________________________________________________________  _____________________________________________________________________  _____________________________________________________________________  _____________________________________________________________________  _____________________________________________________________________  _____________________________________________________________________  _____________________________________________________________________  Squeezing the Jackson-Pratt Bulb- To squeeze the bulb: 1. Make sure the plug at the top of the bulb is open. 2. Squeeze the bulb tightly in your fist. You will hear air squeezing from the bulb. 3. Replace the plug while the bulb is squeezed. 4. Use a safety pin to attach the bulb to your clothing. This will keep the catheter from     pulling at the bulb insertion site.  When to call your doctor- Call your doctor if:  Drain site becomes red, swollen or hot.  You have a fever greater than 101 degrees F.  There is oozing at the drain site.  Drain falls out (apply a guaze bandage over the drain hole and secure it with tape).  Drainage increases daily not related to activity patterns. (You will usually have more drainage when you are active than when you are resting.)  Drainage has a bad odor.  der Special Instructions: Do not raise arms up Continue to empty, recharge, & record drainage from J-P drains &/or Hemovacs 2-3 times a day, as needed. Call Doctor if any unusual problems occur such as pain, excessive Bleeding, unrelieved Nausea/vomiting, Fever &/or chills When lying down, keep head elevated on 2-3 pillows or back-rest For Addominoplasties the  Jack-knife position Follow-up appointment: Please call the office.  The patient received discharge instruction  from:___________________________________________      Patient signature ________________________________________ / Date___________    Signature of individual providing instructions/ Date________________

## 2012-11-28 NOTE — Anesthesia Preprocedure Evaluation (Signed)
Anesthesia Evaluation  Patient identified by MRN, date of birth, ID band Patient awake    Reviewed: Allergy & Precautions, H&P , NPO status , Patient's Chart, lab work & pertinent test results  Airway Mallampati: I TM Distance: >3 FB Neck ROM: Full    Dental   Pulmonary COPD         Cardiovascular     Neuro/Psych Anxiety Depression    GI/Hepatic   Endo/Other  diabetes, Type 2, Oral Hypoglycemic AgentsHypothyroidism   Renal/GU      Musculoskeletal   Abdominal   Peds  Hematology   Anesthesia Other Findings   Reproductive/Obstetrics                           Anesthesia Physical Anesthesia Plan  ASA: II  Anesthesia Plan: General   Post-op Pain Management:    Induction: Intravenous  Airway Management Planned: LMA  Additional Equipment:   Intra-op Plan:   Post-operative Plan: Extubation in OR  Informed Consent: I have reviewed the patients History and Physical, chart, labs and discussed the procedure including the risks, benefits and alternatives for the proposed anesthesia with the patient or authorized representative who has indicated his/her understanding and acceptance.     Plan Discussed with: CRNA and Surgeon  Anesthesia Plan Comments:         Anesthesia Quick Evaluation

## 2012-11-29 NOTE — Op Note (Signed)
NAME:  Christy Fuentes, Christy Fuentes                 ACCOUNT NO.:  [id number]  MEDICAL RECORD NO.:  [id number]  LOCATION:                               FACILITY:  [facility]  PHYSICIAN:  Earvin Hansen L. Shon Hough, M.D.DATE OF BIRTH:  03-30-1965  DATE OF PROCEDURE:  11/28/2012 DATE OF DISCHARGE:  11/28/2012                              OPERATIVE REPORT   A 47 year old lady who has had a right mastectomy for breast cancer is now being prepared for right tissue expansion reconstruction.  All procedures in detail were explained to the patient preoperatively.  The patient consents to surgery.  PROCEDURES DONE:  Exploration of the area, capsulectomy with right breast reconstruction with tissue expansion; expander mentor type smooth round spectrum, serial #[id number], amount placed today 0; also scar revision of the right inner scar, previous mastectomy.  ANESTHESIA:  General.  Preoperatively, the patient was sat up and drawn for all the areas concerned.  She underwent general anesthesia and intubated orally.  Prep was done to the chest and breast areas bilaterally using Hibiclens soap and solution, walled off with sterile towels and drapes so as to make a sterile field; 0.25% Xylocaine with epinephrine was injected locally, a total of 200 mL in 1:400,000 concentration.  This was allowed to set up. A large dog ear deformity was excised from the medial part of the incision that was previously placed transversely.  Then, dissection carried down through to the skin and subcutaneous tissue to underlying pectoralis major muscle.  Muscle was divided in direction of its fibers and showing extreme capsule formation and scar tissue within the area, which was excised.  Hemostasis was maintained with the Bovie and the coagulation.  The superior and inferior planes were developed subpectorally, and after proper hemostasis, I was able to place the mentor implant, which has a capacity of 550 mL fill volume total of 690 mL.   The Style #1400, the serial number again [id number], lot #[id number]. This fit in very nicely.  After this, the patient had been completely dissected, similar to the left side, and then the muscle was then repaired with multiple sutures of 2-0 Monocryl.  A dissected plane was made laterally.  I was able to put large port into it subcutaneously for future injections.  Subcutaneous tissue was closed with 2-0 Monocryl and then a running subcuticular stitch of 3-0 Monocryl.  The wounds were drained with #10 fully fluted Blake drain, which was placed into the depths of wound laterally and brought out through the incision and secured with 3-0 Prolene.  The wounds were cleansed.  Steri-Strips and sterile dressing was applied to all the areas.  She withstood the procedure very well and was taken to recovery in excellent condition.     [name]. Shon Hough, M.D.   ______________________________ [name]. Shon Hough, M.D.    Cathie Hoops  D:  11/28/2012  T:  11/29/2012  Job:  [id number]

## 2012-11-30 ENCOUNTER — Encounter (HOSPITAL_BASED_OUTPATIENT_CLINIC_OR_DEPARTMENT_OTHER): Payer: Self-pay | Admitting: Specialist

## 2013-06-29 ENCOUNTER — Ambulatory Visit (INDEPENDENT_AMBULATORY_CARE_PROVIDER_SITE_OTHER): Payer: Medicaid Other

## 2013-06-29 VITALS — BP 129/85 | HR 79 | Resp 18

## 2013-06-29 DIAGNOSIS — B351 Tinea unguium: Secondary | ICD-10-CM

## 2013-06-29 DIAGNOSIS — B353 Tinea pedis: Secondary | ICD-10-CM

## 2013-06-29 DIAGNOSIS — J449 Chronic obstructive pulmonary disease, unspecified: Secondary | ICD-10-CM

## 2013-06-29 DIAGNOSIS — F329 Major depressive disorder, single episode, unspecified: Secondary | ICD-10-CM | POA: Insufficient documentation

## 2013-06-29 DIAGNOSIS — M1990 Unspecified osteoarthritis, unspecified site: Secondary | ICD-10-CM | POA: Insufficient documentation

## 2013-06-29 DIAGNOSIS — E039 Hypothyroidism, unspecified: Secondary | ICD-10-CM | POA: Insufficient documentation

## 2013-06-29 DIAGNOSIS — F32A Depression, unspecified: Secondary | ICD-10-CM | POA: Insufficient documentation

## 2013-06-29 DIAGNOSIS — F419 Anxiety disorder, unspecified: Secondary | ICD-10-CM | POA: Insufficient documentation

## 2013-06-29 DIAGNOSIS — G4730 Sleep apnea, unspecified: Secondary | ICD-10-CM | POA: Insufficient documentation

## 2013-06-29 DIAGNOSIS — E1140 Type 2 diabetes mellitus with diabetic neuropathy, unspecified: Secondary | ICD-10-CM | POA: Insufficient documentation

## 2013-06-29 DIAGNOSIS — M79609 Pain in unspecified limb: Secondary | ICD-10-CM

## 2013-06-29 DIAGNOSIS — E1149 Type 2 diabetes mellitus with other diabetic neurological complication: Secondary | ICD-10-CM

## 2013-06-29 MED ORDER — CLOTRIMAZOLE 1 % EX CREA: TOPICAL_CREAM | CUTANEOUS | Status: AC

## 2013-06-29 MED ORDER — CICLOPIROX 8 % EX SOLN: Freq: Every day | CUTANEOUS | Status: AC

## 2013-06-29 NOTE — Progress Notes (Signed)
Subjective:    Patient ID: Christy Fuentes, female    DOB: 1965/09/03, 48 y.o.   MRN: [id number]  HPI check my feet and they do crack and peeling on both my heels and sore and tender and burns and throbs and numbness on side of left foot and feet stay cold all the time and I am a diabetic and have been for about 15 years    Review of Systems  Constitutional: Positive for chills, activity change, appetite change and unexpected weight change.       Sweating   HENT: Positive for ear pain, sinus pressure, sneezing and trouble swallowing.        Ringing in ears  Respiratory: Positive for apnea and shortness of breath.        Difficulty breathing  Cardiovascular: Positive for palpitations.  Gastrointestinal: Positive for nausea, abdominal pain, diarrhea and constipation.  Endocrine:       Excessive thirst  Musculoskeletal: Positive for back pain and gait problem.       Joint and muscle pain  Skin:       Open sores and thick scars  Allergic/Immunologic: Positive for environmental allergies.  Neurological: Positive for dizziness, weakness, numbness and headaches.  Hematological: Bruises/bleeds easily.       Slow to heal  Psychiatric/Behavioral: Positive for hallucinations and behavioral problems. The patient is nervous/anxious.   All other systems reviewed and are negative.      Objective:   Physical Exam 48 year old white female presents at this time for initial visit well-developed well-nourished oriented x3. Patient's complaint of burning and abnormal pains in both feet prosthesis with her diabetes also is dry scaling skin with fissuring around the heels and nails are thick painful tender and crumbly and she is unable to cut herself. Patient has a greater than 10 year history of diabetes which is not a well-controlled adjust adjustments or medication. Lower extremity objective findings as follows vascular status appears to be intact although diminished DP +2/4 bilateral PT one over 4  bilateral capillary refill timed 3-4 seconds all digits. Epicritic and proprioceptive sensations intact and symmetric bilateral with decreased sensation Semmes Weinstein testing to the digits plantar forefoot and mid arch intact sensation of the dorsal foot heel and ankle areas. There is normal plantar response DTRs not elicited dermatologically skin color normal pigment normal hair growth diminished absent nails thick brittle crumbly friable discolored tender both on palpation and attempted debridement. Orthopedic biomechanical exam reveals rectus foot type mild bunion deformity or HAV deformity lateral deviation hallux nail with erythema no open wounds or ulcerations mild flexible digital contractures noted as well. Return in a dermatologic exam patient does have dry scaling skin with fissuring in the heels Amoxil distribution of an erythematous macular rash or plaque with a digital fissuring and maceration as well as calcaneal fissuring. No signs of secondary infections no other advised noted patient describes paresthesias burning stinging sensations and abnormal sensation of the foot the shoe when she's sleeping even when she's not doing anything feels like there is something unusual about the feeling in the foot      Assessment & Plan:  Assessment this time is diabetes with history peripheral neuropathy. Vascular status appears to be intact however patient has significant onychomycosis dystrophy discoloration and friability of nails 1 through 5 bilateral and a moccasin distribution of likely tinea pedis with fissure dry skin and keratoses multiple fissures. No other secondary infections no bacterial infection is no malodor noted plan at this time  mycotic nails debrided 1 through 5 bilateral the presence of pain in symptomology as well as diabetes and complications patient also at this time as prescribed topical Penlac nail lacquer to apply the affected nails I was daily for 12 months duration also  prescription for clotrimazole antifungal cream to be applied twice daily to the affected areas of the foot and a moccasin distribution for 6-8 week duration. Recheck in 3-4 months for continued palliative nail care in the future and for foot evaluation for diabetic neuropathy and complications. Should note patient currently taking gabapentin for primary physician best to continue to do so harm any discuss with her physician adjustments of a dose of if she continues to have pain in symptomology.  Harriet Masson DPM

## 2013-06-29 NOTE — Patient Instructions (Signed)
Diabetes and Foot Care Diabetes may cause you to have problems because of poor blood supply (circulation) to your feet and legs. This may cause the skin on your feet to become thinner, break easier, and heal more slowly. Your skin may become dry, and the skin may peel and crack. You may also have nerve damage in your legs and feet causing decreased feeling in them. You may not notice minor injuries to your feet that could lead to infections or more serious problems. Taking care of your feet is one of the most important things you can do for yourself.  HOME CARE INSTRUCTIONS  Wear shoes at all times, even in the house. Do not go barefoot. Bare feet are easily injured.  Check your feet daily for blisters, cuts, and redness. If you cannot see the bottom of your feet, use a mirror or ask someone for help.  Wash your feet with warm water (do not use hot water) and mild soap. Then pat your feet and the areas between your toes until they are completely dry. Do not soak your feet as this can dry your skin.  Apply a moisturizing lotion or petroleum jelly (that does not contain alcohol and is unscented) to the skin on your feet and to dry, brittle toenails. Do not apply lotion between your toes.  Trim your toenails straight across. Do not dig under them or around the cuticle. File the edges of your nails with an emery board or nail file.  Do not cut corns or calluses or try to remove them with medicine.  Wear clean socks or stockings every day. Make sure they are not too tight. Do not wear knee-high stockings since they may decrease blood flow to your legs.  Wear shoes that fit properly and have enough cushioning. To break in new shoes, wear them for just a few hours a day. This prevents you from injuring your feet. Always look in your shoes before you put them on to be sure there are no objects inside.  Do not cross your legs. This may decrease the blood flow to your feet.  If you find a minor scrape,  cut, or break in the skin on your feet, keep it and the skin around it clean and dry. These areas may be cleansed with mild soap and water. Do not cleanse the area with peroxide, alcohol, or iodine.  When you remove an adhesive bandage, be sure not to damage the skin around it.  If you have a wound, look at it several times a day to make sure it is healing.  Do not use heating pads or hot water bottles. They may burn your skin. If you have lost feeling in your feet or legs, you may not know it is happening until it is too late.  Make sure your health care provider performs a complete foot exam at least annually or more often if you have foot problems. Report any cuts, sores, or bruises to your health care provider immediately. SEEK MEDICAL CARE IF:   You have an injury that is not healing.  You have cuts or breaks in the skin.  You have an ingrown nail.  You notice redness on your legs or feet.  You feel burning or tingling in your legs or feet.  You have pain or cramps in your legs and feet.  Your legs or feet are numb.  Your feet always feel cold. SEEK IMMEDIATE MEDICAL CARE IF:   There is increasing redness,   swelling, or pain in or around a wound.  There is a red line that goes up your leg.  Pus is coming from a wound.  You develop a fever or as directed by your health care provider.  You notice a bad smell coming from an ulcer or wound. Document Released: 02/21/2000 Document Revised: 10/26/2012 Document Reviewed: 08/02/2012 [facility] Patient Information 2014 [place].  Onychomycosis/Fungal Toenails  WHAT IS IT? An infection that lies within the keratin of your nail plate that is caused by a fungus.  WHY ME? Fungal infections affect all ages, sexes, races, and creeds.  There may be many factors that predispose you to a fungal infection such as age, coexisting medical conditions such as diabetes, or an autoimmune disease; stress, medications, fatigue, genetics,  etc.  Bottom line: fungus thrives in a warm, moist environment and your shoes offer such a location.  IS IT CONTAGIOUS? Theoretically, yes.  You do not want to share shoes, nail clippers or files with someone who has fungal toenails.  Walking around barefoot in the same room or sleeping in the same bed is unlikely to transfer the organism.  It is important to realize, however, that fungus can spread easily from one nail to the next on the same foot.  HOW DO WE TREAT THIS?  There are several ways to treat this condition.  Treatment may depend on many factors such as age, medications, pregnancy, liver and kidney conditions, etc.  It is best to ask your doctor which options are available to you.  1. No treatment.   Unlike many other medical concerns, you can live with this condition.  However for many people this can be a painful condition and may lead to ingrown toenails or a bacterial infection.  It is recommended that you keep the nails cut short to help reduce the amount of fungal nail. 2. Topical treatment.  These range from herbal remedies to prescription strength nail lacquers.  About 40-50% effective, topicals require twice daily application for approximately 9 to 12 months or until an entirely new nail has grown out.  The most effective topicals are medical grade medications available through physicians offices. 3. Oral antifungal medications.  With an 80-90% cure rate, the most common oral medication requires 3 to 4 months of therapy and stays in your system for a year as the new nail grows out.  Oral antifungal medications do require blood work to make sure it is a safe drug for you.  A liver function panel will be performed prior to starting the medication and after the first month of treatment.  It is important to have the blood work performed to avoid any harmful side effects.  In general, this medication safe but blood work is required. 4. Laser Therapy.  This treatment is performed by applying  a specialized laser to the affected nail plate.  This therapy is noninvasive, fast, and non-painful.  It is not covered by insurance and is therefore, out of pocket.  The results have been very good with a 80-95% cure rate.  The [facility] is the only practice in the area to offer this therapy. 5. Permanent Nail Avulsion.  Removing the entire nail so that a new nail will not grow back.  Apply topical nail antifungal generic for Penlac once daily to each affected nail for 12 months duration. Once every 7 days you need to remove the Medicaid nail polish utilizing alcohol or nail polish remover and then continue applying daily as  instructed  Athlete's Foot Athlete's foot (tinea pedis) is a fungal infection of the skin on the feet. It often occurs on the skin between the toes or underneath the toes. It can also occur on the soles of the feet. Athlete's foot is more likely to occur in hot, humid weather. Not washing your feet or changing your socks often enough can contribute to athlete's foot. The infection can spread from person to person (contagious). CAUSES Athlete's foot is caused by a fungus. This fungus thrives in warm, moist places. Most people get athlete's foot by sharing shower stalls, towels, and wet floors with an infected person. People with weakened immune systems, including those with diabetes, may be more likely to get athlete's foot. SYMPTOMS   Itchy areas between the toes or on the soles of the feet.  White, flaky, or scaly areas between the toes or on the soles of the feet.  Tiny, intensely itchy blisters between the toes or on the soles of the feet.  Tiny cuts on the skin. These cuts can develop a bacterial infection.  Thick or discolored toenails. DIAGNOSIS  Your caregiver can usually tell what the problem is by doing a physical exam. Your caregiver may also take a skin sample from the rash area. The skin sample may be examined under a microscope, or it may be tested to  see if fungus will grow in the sample. A sample may also be taken from your toenail for testing. TREATMENT  Over-the-counter and prescription medicines can be used to kill the fungus. These medicines are available as powders or creams. Your caregiver can suggest medicines for you. Fungal infections respond slowly to treatment. You may need to continue using your medicine for several weeks. PREVENTION   Do not share towels.  Wear sandals in wet areas, such as shared locker rooms and shared showers.  Keep your feet dry. Wear shoes that allow air to circulate. Wear cotton or wool socks. HOME CARE INSTRUCTIONS   Take medicines as directed by your caregiver. Do not use steroid creams on athlete's foot.  Keep your feet clean and cool. Wash your feet daily and dry them thoroughly, especially between your toes.  Change your socks every day. Wear cotton or wool socks. In hot climates, you may need to change your socks 2 to 3 times per day.  Wear sandals or canvas tennis shoes with good air circulation.  If you have blisters, soak your feet in Burow's solution or Epsom salts for 20 to 30 minutes, 2 times a day to dry out the blisters. Make sure you dry your feet thoroughly afterward. SEEK MEDICAL CARE IF:   You have a fever.  You have swelling, soreness, warmth, or redness in your foot.  You are not getting better after 7 days of treatment.  You are not completely cured after 30 days.  You have any problems caused by your medicines. MAKE SURE YOU:   Understand these instructions.  Will watch your condition.  Will get help right away if you are not doing well or get worse. Document Released: 02/21/2000 Document Revised: 05/18/2011 Document Reviewed: 12/12/2010 [facility] Patient Information 2014 [place], [place].  Applied as prescribed clotrimazole cream to the affected area of both feet including between the toes twice daily for 6-8 weeks

## 2013-10-19 ENCOUNTER — Ambulatory Visit: Payer: Medicaid Other

## 2014-06-13 ENCOUNTER — Encounter (HOSPITAL_COMMUNITY): Payer: Self-pay | Admitting: *Deleted

## 2014-06-13 ENCOUNTER — Emergency Department (HOSPITAL_COMMUNITY): Payer: Medicaid Other

## 2014-06-13 ENCOUNTER — Emergency Department (HOSPITAL_COMMUNITY)
Admission: EM | Admit: 2014-06-13 | Discharge: 2014-06-13 | Disposition: A | Discharge status: Home / Self Care | Payer: Medicaid Other | Attending: Emergency Medicine | Admitting: Emergency Medicine

## 2014-06-13 DIAGNOSIS — J441 Chronic obstructive pulmonary disease with (acute) exacerbation: Secondary | ICD-10-CM | POA: Diagnosis not present

## 2014-06-13 DIAGNOSIS — F419 Anxiety disorder, unspecified: Secondary | ICD-10-CM | POA: Diagnosis not present

## 2014-06-13 DIAGNOSIS — Z880 Allergy status to penicillin: Secondary | ICD-10-CM | POA: Diagnosis not present

## 2014-06-13 DIAGNOSIS — Z7951 Long term (current) use of inhaled steroids: Secondary | ICD-10-CM | POA: Insufficient documentation

## 2014-06-13 DIAGNOSIS — M25512 Pain in left shoulder: Secondary | ICD-10-CM | POA: Diagnosis not present

## 2014-06-13 DIAGNOSIS — R079 Chest pain, unspecified: Secondary | ICD-10-CM

## 2014-06-13 DIAGNOSIS — M546 Pain in thoracic spine: Secondary | ICD-10-CM | POA: Insufficient documentation

## 2014-06-13 DIAGNOSIS — Z720 Tobacco use: Secondary | ICD-10-CM

## 2014-06-13 DIAGNOSIS — E039 Hypothyroidism, unspecified: Secondary | ICD-10-CM | POA: Insufficient documentation

## 2014-06-13 DIAGNOSIS — R0789 Other chest pain: Secondary | ICD-10-CM | POA: Diagnosis not present

## 2014-06-13 DIAGNOSIS — F329 Major depressive disorder, single episode, unspecified: Secondary | ICD-10-CM | POA: Diagnosis not present

## 2014-06-13 DIAGNOSIS — Z8719 Personal history of other diseases of the digestive system: Secondary | ICD-10-CM | POA: Insufficient documentation

## 2014-06-13 DIAGNOSIS — Z79899 Other long term (current) drug therapy: Secondary | ICD-10-CM | POA: Diagnosis not present

## 2014-06-13 DIAGNOSIS — R1013 Epigastric pain: Secondary | ICD-10-CM | POA: Insufficient documentation

## 2014-06-13 DIAGNOSIS — G4730 Sleep apnea, unspecified: Secondary | ICD-10-CM | POA: Insufficient documentation

## 2014-06-13 DIAGNOSIS — Z9981 Dependence on supplemental oxygen: Secondary | ICD-10-CM | POA: Insufficient documentation

## 2014-06-13 DIAGNOSIS — E1165 Type 2 diabetes mellitus with hyperglycemia: Secondary | ICD-10-CM | POA: Insufficient documentation

## 2014-06-13 DIAGNOSIS — I252 Old myocardial infarction: Secondary | ICD-10-CM | POA: Insufficient documentation

## 2014-06-13 DIAGNOSIS — R739 Hyperglycemia, unspecified: Secondary | ICD-10-CM

## 2014-06-13 DIAGNOSIS — R0602 Shortness of breath: Secondary | ICD-10-CM | POA: Insufficient documentation

## 2014-06-13 DIAGNOSIS — Z859 Personal history of malignant neoplasm, unspecified: Secondary | ICD-10-CM | POA: Diagnosis not present

## 2014-06-13 HISTORY — DX: Acute myocardial infarction, unspecified: I21.9

## 2014-06-13 HISTORY — DX: Malignant (primary) neoplasm, unspecified: C80.1

## 2014-06-13 LAB — CBC WITH DIFFERENTIAL/PLATELET
Basophils Absolute: 0 10*3/uL (ref 0.0–0.1)
Basophils Relative: 0 % (ref 0–1)
Eosinophils Absolute: 0.3 10*3/uL (ref 0.0–0.7)
Eosinophils Relative: 6 % — ABNORMAL HIGH (ref 0–5)
HEMATOCRIT: 41.3 % (ref 36.0–46.0)
Hemoglobin: 13.7 g/dL (ref 12.0–15.0)
Lymphocytes Relative: 13 % (ref 12–46)
Lymphs Abs: 0.6 10*3/uL — ABNORMAL LOW (ref 0.7–4.0)
MCH: 30.2 pg (ref 26.0–34.0)
MCHC: 33.2 g/dL (ref 30.0–36.0)
MCV: 91 fL (ref 78.0–100.0)
MONO ABS: 0.3 10*3/uL (ref 0.1–1.0)
MONOS PCT: 7 % (ref 3–12)
Neutro Abs: 3.6 10*3/uL (ref 1.7–7.7)
Neutrophils Relative %: 74 % (ref 43–77)
PLATELETS: 166 10*3/uL (ref 150–400)
RBC: 4.54 MIL/uL (ref 3.87–5.11)
RDW: 14.3 % (ref 11.5–15.5)
WBC: 4.8 10*3/uL (ref 4.0–10.5)

## 2014-06-13 LAB — URINALYSIS, ROUTINE W REFLEX MICROSCOPIC
BILIRUBIN URINE: NEGATIVE
GLUCOSE, UA: NEGATIVE mg/dL
HGB URINE DIPSTICK: NEGATIVE
KETONES UR: NEGATIVE mg/dL
Nitrite: NEGATIVE
PROTEIN: NEGATIVE mg/dL
Specific Gravity, Urine: 1.018 (ref 1.005–1.030)
Urobilinogen, UA: 2 mg/dL — ABNORMAL HIGH (ref 0.0–1.0)
pH: 6 (ref 5.0–8.0)

## 2014-06-13 LAB — COMPREHENSIVE METABOLIC PANEL
ALT: 47 U/L — ABNORMAL HIGH (ref 0–35)
ANION GAP: 8 (ref 5–15)
AST: 51 U/L — AB (ref 0–37)
Albumin: 3.5 g/dL (ref 3.5–5.2)
Alkaline Phosphatase: 111 U/L (ref 39–117)
BUN: 10 mg/dL (ref 6–23)
CALCIUM: 9.2 mg/dL (ref 8.4–10.5)
CO2: 27 mmol/L (ref 19–32)
Chloride: 100 mmol/L (ref 96–112)
Creatinine, Ser: 0.97 mg/dL (ref 0.50–1.10)
GFR calc Af Amer: 79 mL/min — ABNORMAL LOW (ref >90)
GFR calc non Af Amer: 68 mL/min — ABNORMAL LOW (ref >90)
Glucose, Bld: 215 mg/dL — ABNORMAL HIGH (ref 70–99)
Potassium: 3.8 mmol/L (ref 3.5–5.1)
Sodium: 135 mmol/L (ref 135–145)
TOTAL PROTEIN: 7.3 g/dL (ref 6.0–8.3)
Total Bilirubin: 0.7 mg/dL (ref 0.3–1.2)

## 2014-06-13 LAB — URINE MICROSCOPIC-ADD ON

## 2014-06-13 LAB — D-DIMER, QUANTITATIVE (NOT AT ARMC): D-Dimer, Quant: 0.44 ug/mL-FEU (ref 0.00–0.48)

## 2014-06-13 LAB — I-STAT TROPONIN, ED: TROPONIN I, POC: 0 ng/mL (ref 0.00–0.08)

## 2014-06-13 LAB — LIPASE, BLOOD: Lipase: 55 U/L (ref 11–59)

## 2014-06-13 MED ORDER — ASPIRIN 325 MG PO TABS
325.0000 mg | ORAL_TABLET | Freq: Once | ORAL | Status: AC
  Administered 2014-06-13: 325 mg via ORAL
  Filled 2014-06-13: qty 1

## 2014-06-13 MED ORDER — SODIUM CHLORIDE 0.9 % IV BOLUS (SEPSIS)
1000.0000 mL | Freq: Once | INTRAVENOUS | Status: AC
  Administered 2014-06-13: 1000 mL via INTRAVENOUS

## 2014-06-13 MED ORDER — GI COCKTAIL ~~LOC~~
30.0000 mL | Freq: Once | ORAL | Status: AC
  Administered 2014-06-13: 30 mL via ORAL
  Filled 2014-06-13: qty 30

## 2014-06-13 MED ORDER — MORPHINE SULFATE 4 MG/ML IJ SOLN
4.0000 mg | Freq: Once | INTRAMUSCULAR | Status: AC
  Administered 2014-06-13: 4 mg via INTRAVENOUS
  Filled 2014-06-13: qty 1

## 2014-06-13 MED ORDER — MORPHINE SULFATE 2 MG/ML IJ SOLN
2.0000 mg | Freq: Once | INTRAMUSCULAR | Status: AC
  Administered 2014-06-13: 2 mg via INTRAVENOUS
  Filled 2014-06-13: qty 1

## 2014-06-13 MED ORDER — NITROGLYCERIN 0.4 MG SL SUBL
0.4000 mg | SUBLINGUAL_TABLET | SUBLINGUAL | Status: DC | PRN
  Filled 2014-06-13: qty 1

## 2014-06-13 NOTE — ED Notes (Signed)
Pt alert x4  Respirations easy non labored.

## 2014-06-13 NOTE — Discharge Instructions (Signed)
Use your home pain medications as needed for pain. May consider using heat over the areas of pain, 20 minutes every hour. Follow up with your cardiologist at your appointment next week, and see your plastic surgeon regarding your wound vac. Stay well hydrated. STOP SMOKING! Return to the ER for changes or worsening symptoms.   Chest Pain (Nonspecific) It is often hard to give a specific diagnosis for the cause of chest pain. There is always a chance that your pain could be related to something serious, such as a heart attack or a blood clot in the lungs. You need to follow up with your health care provider for further evaluation. CAUSES   Heartburn.  Pneumonia or bronchitis.  Anxiety or stress.  Inflammation around your heart (pericarditis) or lung (pleuritis or pleurisy).  A blood clot in the lung.  A collapsed lung (pneumothorax). It can develop suddenly on its own (spontaneous pneumothorax) or from trauma to the chest.  Shingles infection (herpes zoster virus). The chest wall is composed of bones, muscles, and cartilage. Any of these can be the source of the pain.  The bones can be bruised by injury.  The muscles or cartilage can be strained by coughing or overwork.  The cartilage can be affected by inflammation and become sore (costochondritis). DIAGNOSIS  Lab tests or other studies may be needed to find the cause of your pain. Your health care provider may have you take a test called an ambulatory electrocardiogram (ECG). An ECG records your heartbeat patterns over a 24-hour period. You may also have other tests, such as:  Transthoracic echocardiogram (TTE). During echocardiography, sound waves are used to evaluate how blood flows through your heart.  Transesophageal echocardiogram (TEE).  Cardiac monitoring. This allows your health care provider to monitor your heart rate and rhythm in real time.  Holter monitor. This is a portable device that records your heartbeat and can  help diagnose heart arrhythmias. It allows your health care provider to track your heart activity for several days, if needed.  Stress tests by exercise or by giving medicine that makes the heart beat faster. TREATMENT   Treatment depends on what may be causing your chest pain. Treatment may include:  Acid blockers for heartburn.  Anti-inflammatory medicine.  Pain medicine for inflammatory conditions.  Antibiotics if an infection is present.  You may be advised to change lifestyle habits. This includes stopping smoking and avoiding alcohol, caffeine, and chocolate.  You may be advised to keep your head raised (elevated) when sleeping. This reduces the chance of acid going backward from your stomach into your esophagus. Most of the time, nonspecific chest pain will improve within 2-3 days with rest and mild pain medicine.  HOME CARE INSTRUCTIONS   If antibiotics were prescribed, take them as directed. Finish them even if you start to feel better.  For the next few days, avoid physical activities that bring on chest pain. Continue physical activities as directed.  Do not use any tobacco products, including cigarettes, chewing tobacco, or electronic cigarettes.  Avoid drinking alcohol.  Only take medicine as directed by your health care provider.  Follow your health care provider's suggestions for further testing if your chest pain does not go away.  Keep any follow-up appointments you made. If you do not go to an appointment, you could develop lasting (chronic) problems with pain. If there is any problem keeping an appointment, call to reschedule. SEEK MEDICAL CARE IF:   Your chest pain does not go away,  even after treatment.  You have a rash with blisters on your chest.  You have a fever. SEEK IMMEDIATE MEDICAL CARE IF:   You have increased chest pain or pain that spreads to your arm, neck, jaw, back, or abdomen.  You have shortness of breath.  You have an increasing  cough, or you cough up blood.  You have severe back or abdominal pain.  You feel nauseous or vomit.  You have severe weakness.  You faint.  You have chills. This is an emergency. Do not wait to see if the pain will go away. Get medical help at once. Call your local emergency services (911 in [place] Do not drive yourself to the hospital. MAKE SURE YOU:   Understand these instructions.  Will watch your condition.  Will get help right away if you are not doing well or get worse. Document Released: 12/03/2004 Document Revised: 02/28/2013 Document Reviewed: 09/29/2007 [facility] Patient Information 2015 [place], [place]. This information is not intended to replace advice given to you by your health care provider. Make sure you discuss any questions you have with your health care provider.  Chest Wall Pain Chest wall pain is pain in or around the bones and muscles of your chest. It may take up to 6 weeks to get better. It may take longer if you must stay physically active in your work and activities.  CAUSES  Chest wall pain may happen on its own. However, it may be caused by:  A viral illness like the flu.  Injury.  Coughing.  Exercise.  Arthritis.  Fibromyalgia.  Shingles. HOME CARE INSTRUCTIONS   Avoid overtiring physical activity. Try not to strain or perform activities that cause pain. This includes any activities using your chest or your abdominal and side muscles, especially if heavy weights are used.  Put ice on the sore area.  Put ice in a plastic bag.  Place a towel between your skin and the bag.  Leave the ice on for 15-20 minutes per hour while awake for the first 2 days.  Only take over-the-counter or prescription medicines for pain, discomfort, or fever as directed by your caregiver. SEEK IMMEDIATE MEDICAL CARE IF:   Your pain increases, or you are very uncomfortable.  You have a fever.  Your chest pain becomes worse.  You have new, unexplained  symptoms.  You have nausea or vomiting.  You feel sweaty or lightheaded.  You have a cough with phlegm (sputum), or you cough up blood. MAKE SURE YOU:   Understand these instructions.  Will watch your condition.  Will get help right away if you are not doing well or get worse. Document Released: 02/23/2005 Document Revised: 05/18/2011 Document Reviewed: 10/20/2010 [facility] Patient Information 2015 [place], [place]. This information is not intended to replace advice given to you by your health care provider. Make sure you discuss any questions you have with your health care provider.  Smoking Cessation Quitting smoking is important to your health and has many advantages. However, it is not always easy to quit since nicotine is a very addictive drug. Oftentimes, people try 3 times or more before being able to quit. This document explains the best ways for you to prepare to quit smoking. Quitting takes hard work and a lot of effort, but you can do it. ADVANTAGES OF QUITTING SMOKING  You will live longer, feel better, and live better.  Your body will feel the impact of quitting smoking almost immediately.  Within 20 minutes, blood pressure decreases.  Your pulse returns to its normal level.  After 8 hours, carbon monoxide levels in the blood return to normal. Your oxygen level increases.  After 24 hours, the chance of having a heart attack starts to decrease. Your breath, hair, and body stop smelling like smoke.  After 48 hours, damaged nerve endings begin to recover. Your sense of taste and smell improve.  After 72 hours, the body is virtually free of nicotine. Your bronchial tubes relax and breathing becomes easier.  After 2 to 12 weeks, lungs can hold more air. Exercise becomes easier and circulation improves.  The risk of having a heart attack, stroke, cancer, or lung disease is greatly reduced.  After 1 year, the risk of coronary heart disease is cut in half.  After 5 years,  the risk of stroke falls to the same as a nonsmoker.  After 10 years, the risk of lung cancer is cut in half and the risk of other cancers decreases significantly.  After 15 years, the risk of coronary heart disease drops, usually to the level of a nonsmoker.  If you are pregnant, quitting smoking will improve your chances of having a healthy baby.  The people you live with, especially any children, will be healthier.  You will have extra money to spend on things other than cigarettes. QUESTIONS TO THINK ABOUT BEFORE ATTEMPTING TO QUIT You may want to talk about your answers with your health care provider.  Why do you want to quit?  If you tried to quit in the past, what helped and what did not?  What will be the most difficult situations for you after you quit? How will you plan to handle them?  Who can help you through the tough times? Your family? Friends? A health care provider?  What pleasures do you get from smoking? What ways can you still get pleasure if you quit? Here are some questions to ask your health care provider:  How can you help me to be successful at quitting?  What medicine do you think would be best for me and how should I take it?  What should I do if I need more help?  What is smoking withdrawal like? How can I get information on withdrawal? GET READY  Set a quit date.  Change your environment by getting rid of all cigarettes, ashtrays, matches, and lighters in your home, car, or work. Do not let people smoke in your home.  Review your past attempts to quit. Think about what worked and what did not. GET SUPPORT AND ENCOURAGEMENT You have a better chance of being successful if you have help. You can get support in many ways.  Tell your family, friends, and coworkers that you are going to quit and need their support. Ask them not to smoke around you.  Get individual, group, or telephone counseling and support. Programs are available at [employer]  and health centers. Call your local health department for information about programs in your area.  Spiritual beliefs and practices may help some smokers quit.  Download a "quit meter" on your computer to keep track of quit statistics, such as how long you have gone without smoking, cigarettes not smoked, and money saved.  Get a self-help book about quitting smoking and staying off tobacco. [place] yourself from urges to smoke. Talk to someone, go for a walk, or occupy your time with a task.  Change your normal routine. Take a different route to work.  Drink tea instead of coffee. Eat breakfast in a different place.  Reduce your stress. Take a hot bath, exercise, or read a book.  Plan something enjoyable to do every day. Reward yourself for not smoking.  Explore interactive web-based programs that specialize in helping you quit. GET MEDICINE AND USE IT CORRECTLY Medicines can help you stop smoking and decrease the urge to smoke. Combining medicine with the above behavioral methods and support can greatly increase your chances of successfully quitting smoking.  Nicotine replacement therapy helps deliver nicotine to your body without the negative effects and risks of smoking. Nicotine replacement therapy includes nicotine gum, lozenges, inhalers, nasal sprays, and skin patches. Some may be available over-the-counter and others require a prescription.  Antidepressant medicine helps people abstain from smoking, but how this works is unknown. This medicine is available by prescription.  Nicotinic receptor partial agonist medicine simulates the effect of nicotine in your brain. This medicine is available by prescription. Ask your health care provider for advice about which medicines to use and how to use them based on your health history. Your health care provider will tell you what side effects to look out for if you choose to be on a medicine or therapy.  Carefully read the information on the package. Do not use any other product containing nicotine while using a nicotine replacement product.  RELAPSE OR DIFFICULT SITUATIONS Most relapses occur within the first 3 months after quitting. Do not be discouraged if you start smoking again. Remember, most people try several times before finally quitting. You may have symptoms of withdrawal because your body is used to nicotine. You may crave cigarettes, be irritable, feel very hungry, cough often, get headaches, or have difficulty concentrating. The withdrawal symptoms are only temporary. They are strongest when you first quit, but they will go away within 10-14 days. To reduce the chances of relapse, try to:  Avoid drinking alcohol. Drinking lowers your chances of successfully quitting.  Reduce the amount of caffeine you consume. Once you quit smoking, the amount of caffeine in your body increases and can give you symptoms, such as a rapid heartbeat, sweating, and anxiety.  Avoid smokers because they can make you want to smoke.  Do not let weight gain distract you. Many smokers will gain weight when they quit, usually less than 10 pounds. Eat a healthy diet and stay active. You can always lose the weight gained after you quit.  Find ways to improve your mood other than smoking. FOR MORE INFORMATION  www.smokefree.gov  Document Released: 02/17/2001 Document Revised: 07/10/2013 Document Reviewed: 06/04/2011 [facility] Patient Information 2015 [place], [place]. This information is not intended to replace advice given to you by your health care provider. Make sure you discuss any questions you have with your health care provider.  Smoking Cessation, Tips for Success If you are ready to quit smoking, congratulations! You have chosen to help yourself be healthier. Cigarettes bring nicotine, tar, carbon monoxide, and other irritants into your body. Your lungs, heart, and blood vessels will be able to work better  without these poisons. There are many different ways to quit smoking. Nicotine gum, nicotine patches, a nicotine inhaler, or nicotine nasal spray can help with physical craving. Hypnosis, support groups, and medicines help break the habit of smoking. WHAT THINGS CAN I DO TO MAKE QUITTING EASIER?  Here are some tips to help you quit for good:  Pick a date when you will quit smoking completely. Tell all of your friends and family  about your plan to quit on that date.  Do not try to slowly cut down on the number of cigarettes you are smoking. Pick a quit date and quit smoking completely starting on that day.  Throw away all cigarettes.   Clean and remove all ashtrays from your home, work, and car.  On a card, write down your reasons for quitting. Carry the card with you and read it when you get the urge to smoke.  Cleanse your body of nicotine. Drink enough water and fluids to keep your urine clear or pale yellow. Do this after quitting to flush the nicotine from your body.  Learn to predict your moods. Do not let a bad situation be your excuse to have a cigarette. Some situations in your life might tempt you into wanting a cigarette.  Never have "just one" cigarette. It leads to wanting another and another. Remind yourself of your decision to quit.  Change habits associated with smoking. If you smoked while driving or when feeling stressed, try other activities to replace smoking. Stand up when drinking your coffee. Brush your teeth after eating. Sit in a different chair when you read the paper. Avoid alcohol while trying to quit, and try to drink fewer caffeinated beverages. Alcohol and caffeine may urge you to smoke.  Avoid foods and drinks that can trigger a desire to smoke, such as sugary or spicy foods and alcohol.  Ask people who smoke not to smoke around you.  Have something planned to do right after eating or having a cup of coffee. For example, plan to take a walk or  exercise.  Try a relaxation exercise to calm you down and decrease your stress. Remember, you may be tense and nervous for the first 2 weeks after you quit, but this will pass.  Find new activities to keep your hands busy. Play with a pen, coin, or rubber band. Doodle or draw things on paper.  Brush your teeth right after eating. This will help cut down on the craving for the taste of tobacco after meals. You can also try mouthwash.   Use oral substitutes in place of cigarettes. Try using lemon drops, carrots, cinnamon sticks, or chewing gum. Keep them handy so they are available when you have the urge to smoke.  When you have the urge to smoke, try deep breathing.  Designate your home as a nonsmoking area.  If you are a heavy smoker, ask your health care provider about a prescription for nicotine chewing gum. It can ease your withdrawal from nicotine.  Reward yourself. Set aside the cigarette money you save and buy yourself something nice.  Look for support from others. Join a support group or smoking cessation program. Ask someone at home or at work to help you with your plan to quit smoking.  Always ask yourself, "Do I need this cigarette or is this just a reflex?" Tell yourself, "Today, I choose not to smoke," or "I do not want to smoke." You are reminding yourself of your decision to quit.  Do not replace cigarette smoking with electronic cigarettes (commonly called e-cigarettes). The safety of e-cigarettes is unknown, and some may contain harmful chemicals.  If you relapse, do not give up! Plan ahead and think about what you will do the next time you get the urge to smoke. HOW WILL I FEEL WHEN I QUIT SMOKING? You may have symptoms of withdrawal because your body is used to nicotine (the addictive substance in cigarettes). You may  crave cigarettes, be irritable, feel very hungry, cough often, get headaches, or have difficulty concentrating. The withdrawal symptoms are only temporary.  They are strongest when you first quit but will go away within 10-14 days. When withdrawal symptoms occur, stay in control. Think about your reasons for quitting. Remind yourself that these are signs that your body is healing and getting used to being without cigarettes. Remember that withdrawal symptoms are easier to treat than the major diseases that smoking can cause.  Even after the withdrawal is over, expect periodic urges to smoke. However, these cravings are generally short lived and will go away whether you smoke or not. Do not smoke! WHAT RESOURCES ARE AVAILABLE TO HELP ME QUIT SMOKING? Your health care provider can direct you to community resources or hospitals for support, which may include:  Group support.  Education.  Hypnosis.  Therapy. Document Released: 11/22/2003 Document Revised: 07/10/2013 Document Reviewed: 08/11/2012 [facility] Patient Information 2015 [place], [place]. This information is not intended to replace advice given to you by your health care provider. Make sure you discuss any questions you have with your health care provider.

## 2014-06-13 NOTE — ED Provider Notes (Signed)
CSN: [id number]     Arrival date & time 06/13/14  1112 History   First MD Initiated Contact with Patient 06/13/14 1139     Chief Complaint  Patient presents with  . Chest Pain     (Consider location/radiation/quality/duration/timing/severity/associated sxs/prior Treatment) HPI Comments: Christy Fuentes is a 49 y.o. female with a PMHx of hypothyroidism, anxiety/depression, COPD, sleep apnea, DM2, arthritis, breast cancer s/p mastectomy with reconstruction and tissue expansion, and MI in February, who presents to the ED with complaints of sudden onset substernal and left-sided chest pain that began at 8 PM last night while she was at rest. She reports the pain is 7/10, constant, sharp, radiating to her left shoulder and back, worse with breathing and movement, and unrelieved with Norco 10 mg. Associated symptoms include lightheadedness which is currently resolved, shortness of breath, and epigastric burning pain and nausea that began today. She had a heart attack in February, and a stent placed at [facility]. She takes aspirin 81 mg daily, Plavix, Lipitor, and has a prescription for nitroglycerin which she did not try today. She states this pain does not feel similar to the pain she experienced with her MI, and denies changes with exertion. She reports that she has a wound VAC on her right chest wall that has been in place for 7 months, cared for by Dr. Towanda Malkin, placed due to a ruptured breast implant 39yrs ago. She states that the chest wall around the wound vac appears slightly more pink than normal, but denies warmth or worsening drainage. She denies any fevers, chills, diaphoresis, jaw/neck pain, arm pain, changes in cough, hemoptysis, wheezing, recent travel/surgery/immobilizations, estrogen therapies, vomiting, diarrhea, constipation, melena, hematochezia, dysuria, hematuria, vaginal bleeding or discharge, numbness, tingling, weakness, leg swelling, orthopnea, PND, palpitations, or sick  contacts. Her cardiologist is Dr. Clydie Braun. Denies EtOH use or IVDU. Has cardiology appt on 06/19/14.  Patient is a 49 y.o. female presenting with chest pain. The history is provided by the patient. No language interpreter was used.  Chest Pain Pain location:  Substernal area and L chest Pain quality: sharp   Pain radiates to:  Mid back and L shoulder Pain radiates to the back: yes   Pain severity:  Moderate Onset quality:  Sudden Duration:  15 hours Timing:  Constant Progression:  Unchanged Chronicity:  New Context: at rest   Relieved by:  None tried Worsened by:  Movement and deep breathing Ineffective treatments: norco 10mg . Associated symptoms: back pain (from chest), nausea and shortness of breath   Associated symptoms: no abdominal pain, no altered mental status, no claudication, no cough, no diaphoresis, no dizziness, no fever, no headache, no heartburn, no lower extremity edema, no near-syncope, no numbness, no orthopnea, no palpitations, no PND, no syncope, not vomiting and no weakness   Risk factors: coronary artery disease, diabetes mellitus, high cholesterol, hypertension and smoking   Risk factors: no birth control, no immobilization, no prior DVT/PE and no surgery     Past Medical History  Diagnosis Date  . Hypothyroidism   . Anxiety   . Depression   . COPD (chronic obstructive pulmonary disease)   . Sleep apnea     uses a cpap  . Diabetes mellitus without complication   . Arthritis   . Hernia   . Bronchitis   . Cancer   . MI (myocardial infarction)    Past Surgical History  Procedure Laterality Date  . Abdominal hysterectomy    . Cholecystectomy    . Back  surgery  2000    lumb lam  . Mastectomy  2007    right mast-snbx-chemo-radiation  . Breast surgery  2011    lt br bx  . Upper gi endoscopy    . Colonoscopy    . Breast reconstruction Right 11/28/2012    Procedure: RIGHT BREAST RECONSTRUCTION WITH PLACEMENT OF TISSUE EXPANDER ;  Surgeon: Cristine Polio, MD;  Location: [facility];  Service: Plastics;  Laterality: Right;  . Tissue expander placement Right 11/28/2012    Procedure: TISSUE EXPANDER;  Surgeon: Cristine Polio, MD;  Location: [facility];  Service: Plastics;  Laterality: Right;   History reviewed. No pertinent family history. History  Substance Use Topics  . Smoking status: Current Every Day Smoker -- 0.25 packs/day  . Smokeless tobacco: Never Used  . Alcohol Use: No     Comment: used to drink   OB History    No data available     Review of Systems  Constitutional: Negative for fever, chills and diaphoresis.  Respiratory: Positive for shortness of breath. Negative for cough and wheezing.   Cardiovascular: Positive for chest pain. Negative for palpitations, orthopnea, claudication, leg swelling, syncope, PND and near-syncope.  Gastrointestinal: Positive for nausea. Negative for heartburn, vomiting, abdominal pain, diarrhea, constipation and blood in stool.  Genitourinary: Negative for dysuria, hematuria, vaginal bleeding and vaginal discharge.  Musculoskeletal: Positive for back pain (from chest). Negative for myalgias, arthralgias and neck pain.  Skin: Positive for color change (trace erythema to wound vac area).  Allergic/Immunologic: Positive for immunocompromised state (diabetic).  Neurological: Positive for light-headedness (now resolved). Negative for dizziness, syncope, weakness, numbness and headaches.  Hematological: Bruises/bleeds easily (on plavix).  Psychiatric/Behavioral: Negative for confusion.   10 Systems reviewed and are negative for acute change except as noted in the HPI.    Allergies  Penicillins  Home Medications   Prior to Admission medications   Medication Sig Start Date End Date Taking? Authorizing Provider  albuterol (PROVENTIL HFA;VENTOLIN HFA) 108 (90 BASE) MCG/ACT inhaler Inhale 2 puffs into the lungs every 6 (six) hours as needed for wheezing.     Historical Provider, MD  ALPRAZolam Duanne Moron) 1 MG tablet Take 1 mg by mouth 3 (three) times daily as needed for sleep.    Historical Provider, MD  budesonide-formoterol (SYMBICORT) 160-4.5 MCG/ACT inhaler Inhale 2 puffs into the lungs 2 (two) times daily.    Historical Provider, MD  ciclopirox (PENLAC) 8 % solution Apply topically at bedtime. Apply over nail and surrounding skin. Apply daily over previous coat. After seven (7) days, may remove with alcohol and continue cycle. Apply daily for complete 12 months 06/29/13   Harriet Masson, DPM  clotrimazole (LOTRIMIN) 1 % cream Apply to affected areas of both feet twice daily for 6-8 weeks 06/29/13   Harriet Masson, DPM  gabapentin (NEURONTIN) 300 MG capsule Take 300 mg by mouth 3 (three) times daily.    Historical Provider, MD  glyBURIDE (DIABETA) 5 MG tablet Take 5 mg by mouth 2 (two) times daily with a meal.    Historical Provider, MD  HYDROcodone-acetaminophen (NORCO) 10-325 MG per tablet Take 1 tablet by mouth every 6 (six) hours as needed.    Historical Provider, MD  levothyroxine (SYNTHROID, LEVOTHROID) 150 MCG tablet Take 150 mcg by mouth daily before breakfast.    Historical Provider, MD  promethazine (PHENERGAN) 25 MG tablet Take 25 mg by mouth every 6 (six) hours as needed for nausea.    Historical Provider, MD  tiotropium (SPIRIVA) 18 MCG inhalation capsule Place 18 mcg into inhaler and inhale daily.    Historical Provider, MD   BP 102/60 mmHg  Pulse 75  Temp(Src) 98.4 F (36.9 C) (Oral)  Resp 20  Ht 5\' 5"  (1.651 m)  Wt 192 lb 3.2 oz (87.181 kg)  BMI 31.98 kg/m2  SpO2 97% Physical Exam  Constitutional: She is oriented to person, place, and time. Vital signs are normal. She appears well-developed and well-nourished.  Non-toxic appearance. No distress.  Afebrile, nontoxic, NAD  HENT:  Head: Normocephalic and atraumatic.  Mouth/Throat: Oropharynx is clear and moist and mucous membranes are normal.  Eyes: Conjunctivae and EOM are normal.  Right eye exhibits no discharge. Left eye exhibits no discharge.  Neck: Normal range of motion. Neck supple.  Cardiovascular: Normal rate, regular rhythm, normal heart sounds and intact distal pulses.  Exam reveals no gallop and no friction rub.   No murmur heard. RRR, nl s1/s2, no m/r/g, distal pulses intact, no pedal edema   Pulmonary/Chest: Effort normal and breath sounds normal. No respiratory distress. She has no decreased breath sounds. She has no wheezes. She has no rhonchi. She has no rales. She exhibits tenderness (sternal and L sided). She exhibits no crepitus, no deformity and no retraction. Right breast exhibits skin change (wound vac).  CTAB in all lung fields, no w/r/r, no hypoxia or increased WOB, speaking in full sentences, SpO2 97% on RA Mild TTP in sternal and L chest wall regions, no crepitus or deformity.  R breast with wound vac, no drainage, trace erythema to wound edges with are nontender, no warmth.   Abdominal: Soft. Normal appearance and bowel sounds are normal. She exhibits no distension. There is tenderness in the epigastric area. There is no rigidity, no rebound, no guarding, no CVA tenderness, no tenderness at McBurney's point and negative Murphy's sign.    Soft, nondistended, +BS throughout, with mild epigastric TTP, no r/g/r, neg murphy's, neg mcburney's, no CVA TTP   Musculoskeletal: Normal range of motion.  MAE x4 Strength and sensation grossly intact Distal pulses intact No pedal edema, neg homan's bilaterally   Neurological: She is alert and oriented to person, place, and time. She has normal strength. No sensory deficit.  No focal deficits  Skin: Skin is warm and dry. No rash noted.  Wound vac to R chest wall as noted above  Psychiatric: She has a normal mood and affect.  Nursing note and vitals reviewed.   ED Course  Procedures (including critical care time) Labs Review Labs Reviewed  CBC WITH DIFFERENTIAL/PLATELET - Abnormal; Notable for the  following:    Lymphs Abs 0.6 (*)    Eosinophils Relative 6 (*)    All other components within normal limits  COMPREHENSIVE METABOLIC PANEL - Abnormal; Notable for the following:    Glucose, Bld 215 (*)    AST 51 (*)    ALT 47 (*)    GFR calc non Af Amer 68 (*)    GFR calc Af Amer 79 (*)    All other components within normal limits  URINALYSIS, ROUTINE W REFLEX MICROSCOPIC - Abnormal; Notable for the following:    APPearance CLOUDY (*)    Urobilinogen, UA 2.0 (*)    Leukocytes, UA SMALL (*)    All other components within normal limits  URINE MICROSCOPIC-ADD ON - Abnormal; Notable for the following:    Squamous Epithelial / LPF MANY (*)    Bacteria, UA MANY (*)    All other components  within normal limits  URINE CULTURE  LIPASE, BLOOD  D-DIMER, QUANTITATIVE  I-STAT TROPOININ, ED    Imaging Review Dg Chest 2 View  06/13/2014   CLINICAL DATA:  Chest pain, shortness of breath  EXAM: CHEST  2 VIEW  COMPARISON:  06/07/2014  FINDINGS: Stable left-sided Port-A-Cath. There is no focal parenchymal opacity, pleural effusion, or pneumothorax. The heart and mediastinal contours are unremarkable.  The osseous structures are unremarkable.  IMPRESSION: No active cardiopulmonary disease.   Electronically Signed   By: Kathreen Devoid   On: 06/13/2014 15:49     EKG Interpretation   Date/Time:  Wednesday June 13 2014 11:19:59 EDT Ventricular Rate:  73 PR Interval:  136 QRS Duration: 88 QT Interval:  426 QTC Calculation: 469 R Axis:   60 Text Interpretation:  Normal sinus rhythm Possible Lateral infarct , age  undetermined Inferior infarct , age undetermined Abnormal ECG New since  previous tracing Confirmed by Winfred Leeds  MD, SAM [phone]) on 06/13/2014  11:37:56 AM      MDM   Final diagnoses:  Chest pain  Tobacco abuse  Chest wall pain  SOB (shortness of breath)  History of MI (myocardial infarction)  Hyperglycemia    49 y.o. female here with sudden onset chest pain at rest that began  last night at 8 PM. She has not taken anything prior to arrival, and complains of pain with inspiration. EKG with Q waves and ischemic changes with T wave inversions in lead III and laterally when compared to EKG from 2014, MI in February. Smoker and multiple RFs for MI, history somewhat concerning for cardiac etiology although she also has a wound vac to R breast, slight erythema around site but no warmth or drainage, and pain is reproducible therefore could be musculoskeletal or referred pain from wound could be etiology. No changes with exertion. Will obtain labs, including D-dimer given pleuritic pain (although no tachycardia or hypoxia therefore low risk for PE/DVT), CXR, and give nitroglycerin, aspirin, morphine, and GI cocktail. Will consider admission for ACS rule out, although patient is very hesitant and does have follow-up in 6 days with her cardiologist. Will reassess shortly. Will also attempt to get prior records from [facility] to compare EKG from February to today's.  2:50 PM Trop neg at 15hr mark, which is reassuring. Pain improved after ASA, morphine, and GI cocktail. Held NTG due to BP being 90s/60s, which is similar to prior values. CBC unremarkable. CMP with mildly elevated AST/ALT but no other abnormalities. Lipase WNL. D-dimer neg. CXR and U/A still pending. HEART score 4 due to age, EKG, and RFs, but given negative troponin at 15hr mark, and the fact that she has cardiology f/up in 6 days, could potentially go home with strict return precautions, but will await CXR results.   4:03 PM U/A contaminated, many squamous, 3-6 WBC, and bacteria, will send for culture but will not treat given that she has no clinical symptoms and this is likely a dirty catch. CXR unremarkable. Pain increasing slightly now, will give morphine 2mg  now. Still awaiting [facility] records to compare EKG from after her MI vs today. Pt updated and agrees with plan. Will await records. Also counseled  on smoking cessation.  4:23 PM Records coming in from [facility]SH, EKG compared from 05/18/14, no acute changes on today's EKG compared to 05/18/14 EKG, tracings with same Q waves and T wave inversions. Given this, will d/c home and advised pt to f/up with her cardiologist at her  already scheduled appointment. Discussed taking her home norco and tramadol for pain, and f/up with Dr. Towanda Malkin regarding her wound vac, although doubt developing cellulitis. This seems largely musculoskeletal. Discussed ice/heat therapy. I explained the diagnosis and have given explicit precautions to return to the ER including for any other new or worsening symptoms. The patient understands and accepts the medical plan as it's been dictated and I have answered their questions. Discharge instructions concerning home care and prescriptions have been given. The patient is STABLE and is discharged to home in good condition.  BP 104/70 mmHg  Pulse 72  Temp(Src) 98.4 F (36.9 C) (Oral)  Resp 15  Ht 5\' 5"  (1.651 m)  Wt 192 lb 3.2 oz (87.181 kg)  BMI 31.98 kg/m2  SpO2 100%  Meds ordered this encounter  Medications  . nitroGLYCERIN (NITROSTAT) SL tablet 0.4 mg    Sig:   . aspirin tablet 325 mg    Sig:   . morphine 4 MG/ML injection 4 mg    Sig:   . sodium chloride 0.9 % bolus 1,000 mL    Sig:   . gi cocktail (Maalox,Lidocaine,Donnatal)    Sig:   . sodium chloride 0.9 % bolus 1,000 mL    Sig:   . morphine 2 MG/ML injection 2 mg    Sig:      Cybele Maule Camprubi-Soms, PA-C 06/13/14 1629  Orlie Dakin, MD 06/13/14 1726

## 2014-06-13 NOTE — ED Notes (Signed)
Pt has wound vac to right chest, hx of MI in feb. Onset last night of mid and left side chest pains.

## 2014-06-13 NOTE — ED Provider Notes (Signed)
Complains of anterior chest pain constant onset 8 PM yesterday. Nonexertional. No shortness of breath nausea or sweatiness. Does not feel like MI she had February 2016. No other associated symptoms. Patient has wound VAC at right anterior chest, which she's had for 8 months. On exam  lungs clear to auscultation heart regular rate and rhythm chest is tender anteriorly, reproducing pain exactly Heart score equals 4 based on EKG, age and risk factors. Story is highly atypical for acute coronary syndrome. Patient has cardiology follow-up 06/19/2014. Counseled patient for 5 minutes on smoking cessation  Orlie Dakin, MD 06/13/14 1726

## 2014-06-15 LAB — URINE CULTURE: Colony Count: 9000

## 2015-01-24 DIAGNOSIS — Z9221 Personal history of antineoplastic chemotherapy: Secondary | ICD-10-CM | POA: Diagnosis not present

## 2015-01-24 DIAGNOSIS — N63 Unspecified lump in breast: Secondary | ICD-10-CM | POA: Diagnosis not present

## 2015-01-24 DIAGNOSIS — Z9011 Acquired absence of right breast and nipple: Secondary | ICD-10-CM | POA: Diagnosis not present

## 2015-01-24 DIAGNOSIS — Z853 Personal history of malignant neoplasm of breast: Secondary | ICD-10-CM | POA: Diagnosis not present

## 2015-02-07 DIAGNOSIS — Z853 Personal history of malignant neoplasm of breast: Secondary | ICD-10-CM | POA: Diagnosis not present

## 2017-12-08 ENCOUNTER — Other Ambulatory Visit: Payer: Self-pay

## 2017-12-08 ENCOUNTER — Encounter (HOSPITAL_COMMUNITY): Payer: Self-pay | Admitting: Emergency Medicine

## 2017-12-08 ENCOUNTER — Emergency Department (HOSPITAL_COMMUNITY)
Admission: EM | Admit: 2017-12-08 | Discharge: 2017-12-08 | Disposition: A | Discharge status: Home / Self Care | Payer: Medicaid Other | Attending: Emergency Medicine | Admitting: Emergency Medicine

## 2017-12-08 DIAGNOSIS — F17210 Nicotine dependence, cigarettes, uncomplicated: Secondary | ICD-10-CM | POA: Insufficient documentation

## 2017-12-08 DIAGNOSIS — Z853 Personal history of malignant neoplasm of breast: Secondary | ICD-10-CM | POA: Diagnosis not present

## 2017-12-08 DIAGNOSIS — Z7984 Long term (current) use of oral hypoglycemic drugs: Secondary | ICD-10-CM | POA: Diagnosis not present

## 2017-12-08 DIAGNOSIS — R21 Rash and other nonspecific skin eruption: Secondary | ICD-10-CM | POA: Insufficient documentation

## 2017-12-08 DIAGNOSIS — E039 Hypothyroidism, unspecified: Secondary | ICD-10-CM | POA: Insufficient documentation

## 2017-12-08 DIAGNOSIS — E119 Type 2 diabetes mellitus without complications: Secondary | ICD-10-CM | POA: Diagnosis not present

## 2017-12-08 DIAGNOSIS — R200 Anesthesia of skin: Secondary | ICD-10-CM | POA: Diagnosis not present

## 2017-12-08 DIAGNOSIS — Z923 Personal history of irradiation: Secondary | ICD-10-CM | POA: Insufficient documentation

## 2017-12-08 DIAGNOSIS — M542 Cervicalgia: Secondary | ICD-10-CM | POA: Insufficient documentation

## 2017-12-08 DIAGNOSIS — Z7902 Long term (current) use of antithrombotics/antiplatelets: Secondary | ICD-10-CM | POA: Insufficient documentation

## 2017-12-08 DIAGNOSIS — Z79899 Other long term (current) drug therapy: Secondary | ICD-10-CM | POA: Insufficient documentation

## 2017-12-08 DIAGNOSIS — I252 Old myocardial infarction: Secondary | ICD-10-CM | POA: Diagnosis not present

## 2017-12-08 DIAGNOSIS — Z9221 Personal history of antineoplastic chemotherapy: Secondary | ICD-10-CM | POA: Diagnosis not present

## 2017-12-08 DIAGNOSIS — J449 Chronic obstructive pulmonary disease, unspecified: Secondary | ICD-10-CM | POA: Diagnosis not present

## 2017-12-08 MED ORDER — PERMETHRIN 5 % EX CREA: TOPICAL_CREAM | CUTANEOUS | 0 refills | Status: AC

## 2017-12-08 MED ORDER — METHOCARBAMOL 500 MG PO TABS: 500.0000 mg | ORAL_TABLET | Freq: Two times a day (BID) | ORAL | 0 refills | Status: AC

## 2017-12-08 NOTE — ED Provider Notes (Signed)
[facility] EMERGENCY DEPARTMENT Provider Note   CSN: [id number] Arrival date & time: 12/08/17  0242     History   Chief Complaint Chief Complaint  Patient presents with  . Rash  . Neck Pain    HPI Christy Fuentes is a 52 y.o. female.  HPI  Patient is a 52 year old female with a history of COPD, MI, hypothyroidism, anxiety presenting for rash of buttocks, arms, and face, and left-sided neck pain.  Patient reports that the rash is most bothersome to her today.  She reports that approximately 1 week ago, she began having a vesicular maculopapular type rash just below bilateral buttocks.  Patient reports that she scratched it, resulting in excoriation.  Patient reports that she noticed some similar lesions on bilateral upper extremities, and most recently on her face.  Patient denies any recent new medications, soaps, lotions, detergents, or any other topical changes.  Patient is also reporting she has had 1 month of left-sided neck pain.  She reports she will intermittently have "numbness" bilaterally in all fingers of both hands.  This is not present today.  Patient denies any weakness of upper or lower externally's.  Patient denies any fever or chills.  No neck trauma history.  No history of immunocompromise status. History of right breast cancer in 2007.  Past Medical History:  Diagnosis Date  . Anxiety   . Arthritis   . Bronchitis   . Cancer ([place])   . COPD (chronic obstructive pulmonary disease) ([place])   . Depression   . Diabetes mellitus without complication ([place])   . Hernia   . Hypothyroidism   . MI (myocardial infarction) ([place])   . Sleep apnea    uses a cpap    Patient Active Problem List   Diagnosis Date Noted  . COPD (chronic obstructive pulmonary disease) ([place]) 06/29/2013  . Type 2 diabetes, controlled, with neuropathy ([place]) 06/29/2013  . Hypothyroidism 06/29/2013  . Anxiety 06/29/2013  . Depression 06/29/2013  . Sleep apnea 06/29/2013  .  Osteoarthritis 06/29/2013    Past Surgical History:  Procedure Laterality Date  . ABDOMINAL HYSTERECTOMY    . BACK SURGERY  2000   lumb lam  . BREAST RECONSTRUCTION Right 11/28/2012   Procedure: RIGHT BREAST RECONSTRUCTION WITH PLACEMENT OF TISSUE EXPANDER ;  Surgeon: Cristine Polio, MD;  Location: [facility];  Service: Plastics;  Laterality: Right;  . BREAST SURGERY  2011   lt br bx  . CHOLECYSTECTOMY    . COLONOSCOPY    . MASTECTOMY  2007   right mast-snbx-chemo-radiation  . TISSUE EXPANDER PLACEMENT Right 11/28/2012   Procedure: TISSUE EXPANDER;  Surgeon: Cristine Polio, MD;  Location: [facility];  Service: Plastics;  Laterality: Right;  . UPPER GI ENDOSCOPY       OB History   None      Home Medications    Prior to Admission medications   Medication Sig Start Date End Date Taking? Authorizing Provider  albuterol (PROVENTIL HFA;VENTOLIN HFA) 108 (90 BASE) MCG/ACT inhaler Inhale 2 puffs into the lungs every 6 (six) hours as needed for wheezing.    [provider]  ALPRAZolam Duanne Moron) 1 MG tablet Take 1 mg by mouth 3 (three) times daily as needed for anxiety or sleep.     [provider]  atorvastatin (LIPITOR) 80 MG tablet Take 80 mg by mouth at bedtime. 06/05/14   [provider]  budesonide-formoterol (SYMBICORT) 160-4.5 MCG/ACT inhaler Inhale 2 puffs into the lungs  2 (two) times daily.    [provider]  ciclopirox (PENLAC) 8 % solution Apply topically at bedtime. Apply over nail and surrounding skin. Apply daily over previous coat. After seven (7) days, may remove with alcohol and continue cycle. Apply daily for complete 12 months Patient not taking: Reported on 06/13/2014 06/29/13   Harriet Masson, DPM  clopidogrel (PLAVIX) 75 MG tablet Take 75 mg by mouth daily.  06/05/14   [provider]  clotrimazole (LOTRIMIN) 1 % cream Apply to affected areas of both feet twice daily for 6-8 weeks Patient not  taking: Reported on 06/13/2014 06/29/13   Harriet Masson, DPM  cyclobenzaprine (FLEXERIL) 5 MG tablet Take 5 mg by mouth 3 (three) times daily as needed. For spasms 05/09/14   [provider]  gabapentin (NEURONTIN) 300 MG capsule Take 600 mg by mouth 3 (three) times daily.     [provider]  glimepiride (AMARYL) 4 MG tablet Take 4 mg by mouth daily with breakfast.    [provider]  HYDROcodone-acetaminophen (NORCO) 10-325 MG per tablet Take 1 tablet by mouth every 6 (six) hours as needed for moderate pain.     [provider]  levothyroxine (SYNTHROID, LEVOTHROID) 150 MCG tablet Take 150 mcg by mouth daily before breakfast.    [provider]  metoprolol tartrate (LOPRESSOR) 25 MG tablet Take 12.5 mg by mouth 2 (two) times daily.  06/05/14   [provider]  NITROSTAT 0.4 MG SL tablet 0.4 mLs every 5 (five) minutes x 3 doses as needed. 05/11/14   [provider]  promethazine (PHENERGAN) 25 MG tablet Take 25 mg by mouth every 6 (six) hours as needed for nausea.    [provider]  tiotropium (SPIRIVA) 18 MCG inhalation capsule Place 18 mcg into inhaler and inhale daily.    [provider]  traMADol (ULTRAM) 50 MG tablet Take 50 mg by mouth 2 (two) times daily as needed for moderate pain.  04/19/14   [provider]  Vitamin D, Ergocalciferol, (DRISDOL) 50000 UNITS CAPS capsule Take 50,000 Units by mouth once a week. 06/05/14   [provider]    Family History No family history on file.  Social History Social History   Tobacco Use  . Smoking status: Current Every Day Smoker    Packs/day: 0.25  . Smokeless tobacco: Never Used  Substance Use Topics  . Alcohol use: No    Comment: used to drink  . Drug use: No     Allergies   Penicillins and Roxicodone [oxycodone hcl]   Review of Systems Review of Systems  Constitutional: Negative for chills and fever.  Musculoskeletal: Positive for  myalgias and neck pain. Negative for neck stiffness.  Skin: Positive for rash.  Allergic/Immunologic: Negative for immunocompromised state.  Neurological: Negative for weakness and numbness.     Physical Exam Updated Vital Signs BP 123/84 (BP Location: Right Arm)   Pulse (!) 102   Temp 98.8 F (37.1 C)   Resp 18   SpO2 94%   Physical Exam  Constitutional: She appears well-developed and well-nourished. No distress.  Sitting comfortably in bed.  HENT:  Head: Normocephalic and atraumatic.  Eyes: Conjunctivae are normal. Right eye exhibits no discharge. Left eye exhibits no discharge.  EOMs normal to gross examination.  Neck: Normal range of motion.  Cardiovascular: Normal rate and regular rhythm.  Intact, 2+ radial pulse.  Pulmonary/Chest:  Normal respiratory effort. Patient converses comfortably. No audible wheeze or stridor.  Abdominal:  She exhibits no distension.  Musculoskeletal: Normal range of motion.  PALPATION: No midline but left paraspinal musculature tenderness of cervical spine. ROM of cervical spine intact with flexion/extension/lateral flexion/lateral rotation; Patient can laterally rotate cervical spine greater than 45 degrees.  MOTOR: 5/5 strength b/l with resisted shoulder abduction/adduction, biceps flexion (C5/6), biceps extension (C6-C8), wrist flexion, wrist extension (C6-C8), and grip strength (C7-T1) Patient moves LEs symmetrically and with good coordination. Patient ambulates symmetrically with no evidence of LE weakness.  Neurological: She is alert.  Cranial nerves intact to gross observation. Patient moves extremities without difficulty.  Skin: Skin is warm and dry. She is not diaphoretic.  Examination buttocks performed with RN chaperone present.  Patient has bilateral and symmetric macular and excoriated lesions just distal to bilateral buttocks.  Patient has multiple, scabbed over lesions of right and left upper extremities.  Patient has larger,  scabbed over lesions of chin and nose.  No surrounding erythema.  Psychiatric: She has a normal mood and affect. Her behavior is normal. Judgment and thought content normal.  Nursing note and vitals reviewed.      ED Treatments / Results  Labs (all labs ordered are listed, but only abnormal results are displayed) Labs Reviewed - No data to display  EKG None  Radiology No results found.  Procedures Procedures (including critical care time)  Medications Ordered in ED Medications - No data to display   Initial Impression / Assessment and Plan / ED Course  I have reviewed the triage vital signs and the nursing notes.  Pertinent labs & imaging results that were available during my care of the patient were reviewed by me and considered in my medical decision making (see chart for details).     Patient nontoxic-appearing and in no acute distress.  Diagnosis for patient's rash includes contact dermatitis, scabies, bedbugs.  Do not suspect cutaneous manifestations of systemic disease.  No evidence of bacterial illness.  Feel that patient's rash on face is excoriation secondary to itching, and is of different etiology than the lesions of buttocks.  Lesions of buttocks more consistent with scabies.  Will treat with permethrin cream.  Regarding patient's neck pain, feel it is most consistent with muscle spasm versus cervical radiculopathy.  No red flag symptoms.  Instructed patient on use of muscle relaxants sparingly as needed, and salon pus patches as well as neck exercises.  Patient was given return precautions for any fevers, chills, worsening rash, rash of the palms or soles.  Patient was instructed return for any worsening neck pain, weakness, numbness, difficulty walking, saddle anesthesia, loss of bowel or bladder control.  Patient is in understanding and agrees with the plan of care.  Final Clinical Impressions(s) / ED Diagnoses   Final diagnoses:  Rash and nonspecific skin  eruption  Neck pain    ED Discharge Orders         Ordered    permethrin (ELIMITE) 5 % cream     12/08/17 0538    methocarbamol (ROBAXIN) 500 MG tablet  2 times daily     12/08/17 0538           Albesa Seen, PA-C 12/08/17 0541    Fatima Blank, MD 12/08/17 [id number]

## 2017-12-08 NOTE — ED Triage Notes (Signed)
Pt reports neck pain x 1 month with numbness in her left hand/fingers. Pt also reporting a rash to her face and arms that stated 1 week ago.

## 2017-12-08 NOTE — Discharge Instructions (Addendum)
Please see the information and instructions below regarding your visit.  Your diagnoses today include:  1. Rash and nonspecific skin eruption   2. Neck pain     Tests performed today include: See side panel of your discharge paperwork for testing performed today. Vital signs are listed at the bottom of these instructions.   Medications prescribed:    Take any prescribed medications only as prescribed, and any over the counter medications only as directed on the packaging.  Please apply the permethrin cream to the affected area on your buttocks.  Do not use on face.  Please use antibiotic ointment such as Neosporin on the lesions in your face.  You are prescribed Robaxin, a muscle relaxant. Some common side effects of this medication include:  Feeling sleepy.  Dizziness. Take care upon going from a seated to a standing position.  Dry mouth.  Feeling tired or weak.  Hard stools (constipation).  Upset stomach. These are not all of the side effects that may occur. If you have questions about side effects, call your doctor. Call your primary care provider for medical advice about side effects.  This medication can be sedating. Only take this medication as needed. Please do not combine with alcohol. Do not drive or operate machinery while taking this medication.   This medication can interact with some other medications. Make sure to tell any provider you are taking this medication before they prescribe you a new medication.    Home care instructions:  Please follow any educational materials contained in this packet.   Until we can determine this rashes caused by scabies, please wash all bedding, clothing you came in contact with, and the items of anyone who he lives with.  Follow-up instructions: Please follow-up with your primary care provider in 5 days for further evaluation of your symptoms if they are not completely improved.    Return instructions:  Please return to the  Emergency Department if you experience worsening symptoms.  Return the emergency department for any worsening rash, fevers, chills, other systemic symptoms. Return if your neck pain gets worse, you get progressive weakness, numbness, numbness in the groin, difficulty walking, loss of bowel or bladder control, or fever with neck pain. Please return if you have any other emergent concerns.  Additional Information:   Your vital signs today were: BP 114/65 (BP Location: Left Arm)    Pulse 79    Temp 98.7 F (37.1 C) (Oral)    Resp 16    SpO2 95%  If your blood pressure (BP) was elevated on multiple readings during this visit above 130 for the top number or above 80 for the bottom number, please have this repeated by your primary care provider within one month. --------------  Thank you for allowing [place] to participate in your care today.

## 2017-12-12 ENCOUNTER — Emergency Department (HOSPITAL_COMMUNITY): Payer: Medicaid Other

## 2017-12-12 ENCOUNTER — Other Ambulatory Visit: Payer: Self-pay

## 2017-12-12 ENCOUNTER — Encounter (HOSPITAL_COMMUNITY): Payer: Self-pay

## 2017-12-12 ENCOUNTER — Emergency Department (HOSPITAL_COMMUNITY)
Admission: EM | Admit: 2017-12-12 | Discharge: 2017-12-12 | Disposition: A | Discharge status: Home / Self Care | Payer: Medicaid Other | Attending: Emergency Medicine | Admitting: Emergency Medicine

## 2017-12-12 DIAGNOSIS — Y9289 Other specified places as the place of occurrence of the external cause: Secondary | ICD-10-CM | POA: Diagnosis not present

## 2017-12-12 DIAGNOSIS — E119 Type 2 diabetes mellitus without complications: Secondary | ICD-10-CM | POA: Diagnosis not present

## 2017-12-12 DIAGNOSIS — Y998 Other external cause status: Secondary | ICD-10-CM | POA: Insufficient documentation

## 2017-12-12 DIAGNOSIS — I252 Old myocardial infarction: Secondary | ICD-10-CM | POA: Insufficient documentation

## 2017-12-12 DIAGNOSIS — J449 Chronic obstructive pulmonary disease, unspecified: Secondary | ICD-10-CM | POA: Diagnosis not present

## 2017-12-12 DIAGNOSIS — Y9389 Activity, other specified: Secondary | ICD-10-CM | POA: Diagnosis not present

## 2017-12-12 DIAGNOSIS — S0993XA Unspecified injury of face, initial encounter: Secondary | ICD-10-CM | POA: Diagnosis present

## 2017-12-12 DIAGNOSIS — F17210 Nicotine dependence, cigarettes, uncomplicated: Secondary | ICD-10-CM | POA: Insufficient documentation

## 2017-12-12 DIAGNOSIS — S0990XA Unspecified injury of head, initial encounter: Secondary | ICD-10-CM | POA: Insufficient documentation

## 2017-12-12 MED ORDER — HYDROCODONE-ACETAMINOPHEN 5-325 MG PO TABS
1.0000 | ORAL_TABLET | Freq: Once | ORAL | Status: AC
  Administered 2017-12-12: 1 via ORAL
  Filled 2017-12-12: qty 1

## 2017-12-12 NOTE — ED Notes (Signed)
Patient transported to CT 

## 2017-12-12 NOTE — Discharge Instructions (Addendum)
You were evaluated in the Emergency Department and after careful evaluation, we did not find any emergent condition requiring admission or further testing in the hospital. ° °Please return to the Emergency Department if you experience any worsening of your condition.  We encourage you to follow up with a primary care provider.  Thank you for allowing us to be a part of your care. °
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
## 2017-12-12 NOTE — ED Notes (Signed)
Pt on the phone walking around room with no difficulty, stating she needs her discharge paperwork
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
## 2017-12-19 ENCOUNTER — Emergency Department (HOSPITAL_COMMUNITY): Payer: Medicaid Other

## 2017-12-19 ENCOUNTER — Other Ambulatory Visit: Payer: Self-pay

## 2017-12-19 ENCOUNTER — Encounter (HOSPITAL_COMMUNITY): Payer: Self-pay | Admitting: *Deleted

## 2017-12-19 ENCOUNTER — Emergency Department (HOSPITAL_COMMUNITY)
Admission: EM | Admit: 2017-12-19 | Discharge: 2017-12-19 | Disposition: A | Discharge status: Home / Self Care | Payer: Medicaid Other | Attending: Emergency Medicine | Admitting: Emergency Medicine

## 2017-12-19 DIAGNOSIS — Y939 Activity, unspecified: Secondary | ICD-10-CM | POA: Insufficient documentation

## 2017-12-19 DIAGNOSIS — S0285XA Fracture of orbit, unspecified, initial encounter for closed fracture: Secondary | ICD-10-CM

## 2017-12-19 DIAGNOSIS — E119 Type 2 diabetes mellitus without complications: Secondary | ICD-10-CM | POA: Insufficient documentation

## 2017-12-19 DIAGNOSIS — B029 Zoster without complications: Secondary | ICD-10-CM | POA: Diagnosis not present

## 2017-12-19 DIAGNOSIS — Y999 Unspecified external cause status: Secondary | ICD-10-CM | POA: Insufficient documentation

## 2017-12-19 DIAGNOSIS — Z853 Personal history of malignant neoplasm of breast: Secondary | ICD-10-CM | POA: Insufficient documentation

## 2017-12-19 DIAGNOSIS — Y929 Unspecified place or not applicable: Secondary | ICD-10-CM | POA: Diagnosis not present

## 2017-12-19 DIAGNOSIS — E039 Hypothyroidism, unspecified: Secondary | ICD-10-CM | POA: Diagnosis not present

## 2017-12-19 DIAGNOSIS — Z79899 Other long term (current) drug therapy: Secondary | ICD-10-CM | POA: Insufficient documentation

## 2017-12-19 DIAGNOSIS — Z7901 Long term (current) use of anticoagulants: Secondary | ICD-10-CM | POA: Insufficient documentation

## 2017-12-19 DIAGNOSIS — R21 Rash and other nonspecific skin eruption: Secondary | ICD-10-CM | POA: Diagnosis present

## 2017-12-19 DIAGNOSIS — S0231XA Fracture of orbital floor, right side, initial encounter for closed fracture: Secondary | ICD-10-CM | POA: Insufficient documentation

## 2017-12-19 DIAGNOSIS — J449 Chronic obstructive pulmonary disease, unspecified: Secondary | ICD-10-CM | POA: Diagnosis not present

## 2017-12-19 DIAGNOSIS — I2510 Atherosclerotic heart disease of native coronary artery without angina pectoris: Secondary | ICD-10-CM | POA: Insufficient documentation

## 2017-12-19 DIAGNOSIS — F17210 Nicotine dependence, cigarettes, uncomplicated: Secondary | ICD-10-CM | POA: Diagnosis not present

## 2017-12-19 MED ORDER — OXYCODONE-ACETAMINOPHEN 5-325 MG PO TABS: 1.0000 | ORAL_TABLET | ORAL | 0 refills | Status: AC | PRN

## 2017-12-19 MED ORDER — PROPARACAINE HCL 0.5 % OP SOLN
1.0000 [drp] | Freq: Once | OPHTHALMIC | Status: AC
  Administered 2017-12-19: 1 [drp] via OPHTHALMIC
  Filled 2017-12-19: qty 15

## 2017-12-19 MED ORDER — VALACYCLOVIR HCL 500 MG PO TABS
1000.0000 mg | ORAL_TABLET | Freq: Once | ORAL | Status: AC
  Administered 2017-12-19: 1000 mg via ORAL
  Filled 2017-12-19: qty 2

## 2017-12-19 MED ORDER — VALACYCLOVIR HCL 1 G PO TABS: 1000.0000 mg | ORAL_TABLET | Freq: Three times a day (TID) | ORAL | 0 refills | Status: AC

## 2017-12-19 MED ORDER — FLUORESCEIN SODIUM 1 MG OP STRP
1.0000 | ORAL_STRIP | Freq: Once | OPHTHALMIC | Status: AC
  Administered 2017-12-19: 1 via OPHTHALMIC
  Filled 2017-12-19: qty 1

## 2017-12-19 MED ORDER — MORPHINE SULFATE (PF) 4 MG/ML IV SOLN
4.0000 mg | Freq: Once | INTRAVENOUS | Status: AC
  Administered 2017-12-19: 4 mg via INTRAVENOUS
  Filled 2017-12-19: qty 1

## 2017-12-19 NOTE — Discharge Instructions (Signed)
Your CT scan today showed evidence of orbital floor fracture with some tissue stuck in the fracture.  As you are not having double vision persistently or vision changes, the ENT team felt you are safe for discharge home for outpatient follow-up in their clinic in the next several days.  Please follow-up with Dr. Constance Holster with ENT.  We also discovered that your rash is consistent with shingles outbreak.  Please take the antivirals for the next week as recommended by the infectious disease team we spoke with.  They recommended against steroids or other medications at this time.  Please take the pain medicine to help with your symptoms.  If any symptoms change or worsen, please return to the nearest emergency department immediately.

## 2017-12-19 NOTE — ED Notes (Signed)
Called pharmacy for discharge meds
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
## 2017-12-30 ENCOUNTER — Inpatient Hospital Stay: Payer: Medicaid Other

## 2019-06-05 ENCOUNTER — Inpatient Hospital Stay
Admission: AD | Admit: 2019-06-05 | Payer: Medicaid Other | Source: Other Acute Inpatient Hospital | Admitting: Internal Medicine

## 2019-06-06 MED ORDER — COLLAGENASE 250 UNIT/GM EX OINT
TOPICAL_OINTMENT | CUTANEOUS | Status: DC
Start: 2019-06-06 — End: 2019-06-06

## 2019-06-06 MED ORDER — INSULIN NPH (HUMAN) (ISOPHANE) 100 UNIT/ML ~~LOC~~ SUSP
5.00 | SUBCUTANEOUS | Status: DC
Start: 2019-06-06 — End: 2019-06-06

## 2019-06-06 MED ORDER — IPRATROPIUM-ALBUTEROL 0.5-2.5 (3) MG/3ML IN SOLN
3.00 | RESPIRATORY_TRACT | Status: DC
Start: 2019-06-07 — End: 2019-06-06

## 2019-06-06 MED ORDER — CHLORHEXIDINE GLUCONATE 0.12 % MT SOLN
15.00 | OROMUCOSAL | Status: DC
Start: 2019-06-06 — End: 2019-06-06

## 2019-06-06 MED ORDER — FENTANYL CITRATE (PF) 50 MCG/ML IJ SOLN
50.00 | INTRAMUSCULAR | Status: DC
Start: ? — End: 2019-06-06

## 2019-06-06 MED ORDER — PROPOFOL 100 MG/10ML IV EMUL
0.00 | INTRAVENOUS | Status: DC
Start: ? — End: 2019-06-06

## 2019-06-06 MED ORDER — FENTANYL CITRATE (PF) 50 MCG/ML IJ SOLN
10.00 | INTRAMUSCULAR | Status: DC
Start: ? — End: 2019-06-06

## 2019-06-06 MED ORDER — GENERIC EXTERNAL MEDICATION
Status: DC
Start: ? — End: 2019-06-06

## 2019-06-06 MED ORDER — VALPROIC ACID 250 MG/5ML PO SOLN
750.00 | ORAL | Status: DC
Start: 2019-06-06 — End: 2019-06-06

## 2019-06-06 MED ORDER — ATORVASTATIN CALCIUM 40 MG PO TABS
80.00 | ORAL_TABLET | ORAL | Status: DC
Start: 2019-06-07 — End: 2019-06-06

## 2019-06-06 MED ORDER — GENERIC EXTERNAL MEDICATION
2000.00 | Status: DC
Start: 2019-06-06 — End: 2019-06-06

## 2019-06-06 MED ORDER — DEXTROSE 10 % IV SOLN
125.00 | INTRAVENOUS | Status: DC
Start: ? — End: 2019-06-06

## 2019-06-06 MED ORDER — NOREPINEPHRINE-SODIUM CHLORIDE 16-0.9 MG/250ML-% IV SOLN
0.00 | INTRAVENOUS | Status: DC
Start: ? — End: 2019-06-06

## 2019-06-06 MED ORDER — MUPIROCIN 2 % EX OINT
TOPICAL_OINTMENT | CUTANEOUS | Status: DC
Start: 2019-06-06 — End: 2019-06-06

## 2019-06-06 MED ORDER — ALBUTEROL SULFATE (2.5 MG/3ML) 0.083% IN NEBU
2.50 | INHALATION_SOLUTION | RESPIRATORY_TRACT | Status: DC
Start: ? — End: 2019-06-06

## 2019-06-06 MED ORDER — INSULIN LISPRO 100 UNIT/ML ~~LOC~~ SOLN
1.00 | SUBCUTANEOUS | Status: DC
Start: 2019-06-07 — End: 2019-06-06

## 2019-06-06 MED ORDER — ACETAMINOPHEN 325 MG PO TABS
650.00 | ORAL_TABLET | ORAL | Status: DC
Start: 2019-06-06 — End: 2019-06-06

## 2019-06-06 MED ORDER — GLUCOSE 40 % PO GEL
15.00 | ORAL | Status: DC
Start: ? — End: 2019-06-06

## 2019-06-06 MED ORDER — FAMOTIDINE 20 MG PO TABS
20.00 | ORAL_TABLET | ORAL | Status: DC
Start: 2019-06-07 — End: 2019-06-06

## 2019-06-06 MED ORDER — GENERIC EXTERNAL MEDICATION
Status: DC
Start: 2019-06-06 — End: 2019-06-06

## 2019-06-06 MED ORDER — ASPIRIN 81 MG PO CHEW
81.00 | CHEWABLE_TABLET | ORAL | Status: DC
Start: 2019-06-07 — End: 2019-06-06

## 2019-06-06 MED ORDER — HEPARIN SOD (PORCINE) IN D5W 100 UNIT/ML IV SOLN
5.00 | INTRAVENOUS | Status: DC
Start: ? — End: 2019-06-06

## 2019-07-08 DEATH — deceased
